# Patient Record
Sex: Female | Born: 1935 | Race: White | Hispanic: No | Smoking: Never smoker
Health system: Southern US, Community
[De-identification: ages and names within clinical notes are randomized; demographics above are authoritative.]

## PROBLEM LIST (undated history)

## (undated) DIAGNOSIS — I10 Essential (primary) hypertension: Secondary | ICD-10-CM

## (undated) DIAGNOSIS — R413 Other amnesia: Secondary | ICD-10-CM

## (undated) DIAGNOSIS — N183 Chronic kidney disease, stage 3 unspecified: Secondary | ICD-10-CM

## (undated) DIAGNOSIS — E785 Hyperlipidemia, unspecified: Secondary | ICD-10-CM

## (undated) DIAGNOSIS — E1039 Type 1 diabetes mellitus with other diabetic ophthalmic complication: Secondary | ICD-10-CM

## (undated) DIAGNOSIS — I272 Pulmonary hypertension, unspecified: Secondary | ICD-10-CM

## (undated) DIAGNOSIS — E669 Obesity, unspecified: Secondary | ICD-10-CM

## (undated) DIAGNOSIS — I5032 Chronic diastolic (congestive) heart failure: Secondary | ICD-10-CM

## (undated) DIAGNOSIS — I255 Ischemic cardiomyopathy: Secondary | ICD-10-CM

## (undated) DIAGNOSIS — I2510 Atherosclerotic heart disease of native coronary artery without angina pectoris: Secondary | ICD-10-CM

## (undated) HISTORY — DX: Ischemic cardiomyopathy: I25.5

## (undated) HISTORY — DX: Atherosclerotic heart disease of native coronary artery without angina pectoris: I25.10

## (undated) HISTORY — DX: Other amnesia: R41.3

## (undated) HISTORY — DX: Obesity, unspecified: E66.9

## (undated) HISTORY — PX: CORONARY ARTERY BYPASS GRAFT: SHX141

## (undated) HISTORY — DX: Type 1 diabetes mellitus with other diabetic ophthalmic complication: E10.39

## (undated) HISTORY — DX: Hyperlipidemia, unspecified: E78.5

## (undated) HISTORY — DX: Chronic kidney disease, stage 3 (moderate): N18.3

## (undated) HISTORY — DX: Chronic kidney disease, stage 3 unspecified: N18.30

## (undated) HISTORY — DX: Chronic diastolic (congestive) heart failure: I50.32

## (undated) HISTORY — DX: Essential (primary) hypertension: I10

## (undated) HISTORY — DX: Pulmonary hypertension, unspecified: I27.20

---

## 1998-04-20 ENCOUNTER — Ambulatory Visit (HOSPITAL_COMMUNITY): Admission: RE | Admit: 1998-04-20 | Discharge: 1998-04-20 | Payer: Self-pay | Admitting: *Deleted

## 1999-09-14 ENCOUNTER — Emergency Department (HOSPITAL_COMMUNITY): Admission: EM | Admit: 1999-09-14 | Discharge: 1999-09-14 | Payer: Self-pay

## 1999-10-21 ENCOUNTER — Ambulatory Visit (HOSPITAL_COMMUNITY): Admission: RE | Admit: 1999-10-21 | Discharge: 1999-10-21 | Payer: Self-pay | Admitting: Family Medicine

## 1999-10-21 ENCOUNTER — Encounter: Payer: Self-pay | Admitting: Family Medicine

## 1999-10-26 ENCOUNTER — Encounter: Admission: RE | Admit: 1999-10-26 | Discharge: 1999-10-26 | Payer: Self-pay | Admitting: Family Medicine

## 1999-10-26 ENCOUNTER — Encounter: Payer: Self-pay | Admitting: Family Medicine

## 2000-10-29 ENCOUNTER — Ambulatory Visit (HOSPITAL_COMMUNITY): Admission: RE | Admit: 2000-10-29 | Discharge: 2000-10-29 | Payer: Self-pay | Admitting: Obstetrics and Gynecology

## 2000-10-29 ENCOUNTER — Encounter: Payer: Self-pay | Admitting: Obstetrics and Gynecology

## 2002-09-01 ENCOUNTER — Encounter: Admission: RE | Admit: 2002-09-01 | Discharge: 2002-09-01 | Payer: Self-pay | Admitting: Family Medicine

## 2002-09-01 ENCOUNTER — Encounter: Payer: Self-pay | Admitting: Family Medicine

## 2002-09-02 ENCOUNTER — Encounter: Admission: RE | Admit: 2002-09-02 | Discharge: 2002-12-01 | Payer: Self-pay | Admitting: Family Medicine

## 2003-09-25 ENCOUNTER — Encounter: Admission: RE | Admit: 2003-09-25 | Discharge: 2003-09-25 | Payer: Self-pay | Admitting: Family Medicine

## 2003-11-18 ENCOUNTER — Inpatient Hospital Stay (HOSPITAL_COMMUNITY): Admission: EM | Admit: 2003-11-18 | Discharge: 2003-11-24 | Payer: Self-pay | Admitting: Emergency Medicine

## 2003-12-23 ENCOUNTER — Encounter
Admission: RE | Admit: 2003-12-23 | Discharge: 2003-12-23 | Payer: Self-pay | Admitting: Thoracic Surgery (Cardiothoracic Vascular Surgery)

## 2004-01-28 ENCOUNTER — Encounter
Admission: RE | Admit: 2004-01-28 | Discharge: 2004-04-27 | Payer: Self-pay | Admitting: Thoracic Surgery (Cardiothoracic Vascular Surgery)

## 2004-02-08 ENCOUNTER — Encounter (HOSPITAL_COMMUNITY): Admission: RE | Admit: 2004-02-08 | Discharge: 2004-05-08 | Payer: Self-pay | Admitting: *Deleted

## 2004-05-09 ENCOUNTER — Encounter (HOSPITAL_COMMUNITY): Admission: RE | Admit: 2004-05-09 | Discharge: 2004-08-07 | Payer: Self-pay | Admitting: *Deleted

## 2005-08-20 ENCOUNTER — Emergency Department (HOSPITAL_COMMUNITY): Admission: EM | Admit: 2005-08-20 | Discharge: 2005-08-20 | Payer: Self-pay | Admitting: Emergency Medicine

## 2007-01-05 IMAGING — CT CT ABDOMEN W/ CM
2 of 5 series · 17 of 46 positions shown, 19 images · IV contrast (omnipaque)
Comparison: none

CLINICAL DATA: Right lower quadrant pain and vomiting.
 ABDOMEN CT WITH CONTRAST:
TECHNIQUE: Multidetector CT imaging of the abdomen was performed following the standard protocol during bolus administration of intravenous contrast. 
 Contrast:  125 cc Omnipaque 300 IV. 
 No comparison.
TECHNIQUE: Multidetector CT imaging of the pelvis was performed following the standard protocol during bolus administration of intravenous contrast. 

[Series 2: abd_pel 5.0 b40f st · axial · 0.73mm/px · z∈[-372,-32]mm · 14 of 78 slices shown, 16 images]
[im 5/78  soft-tissue]
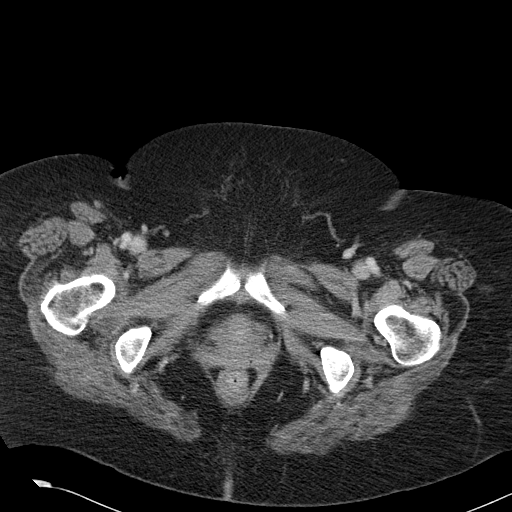
[im 5/78  bone]
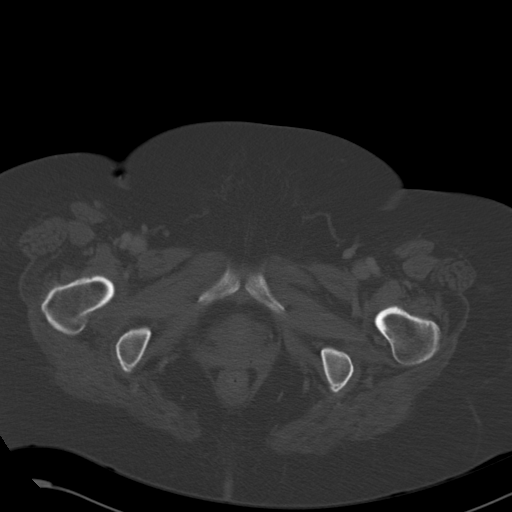
[im 9/78  soft-tissue]
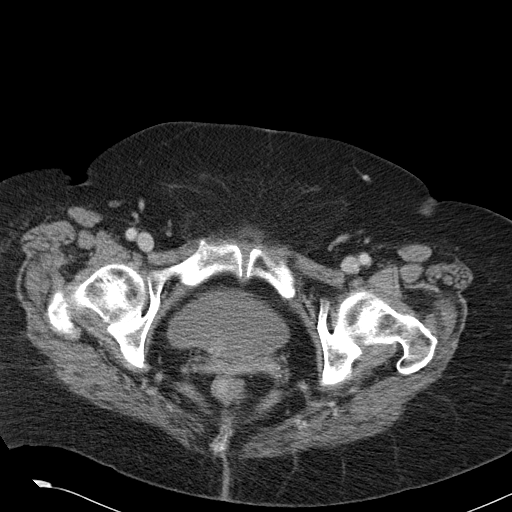
[im 17/78  soft-tissue]
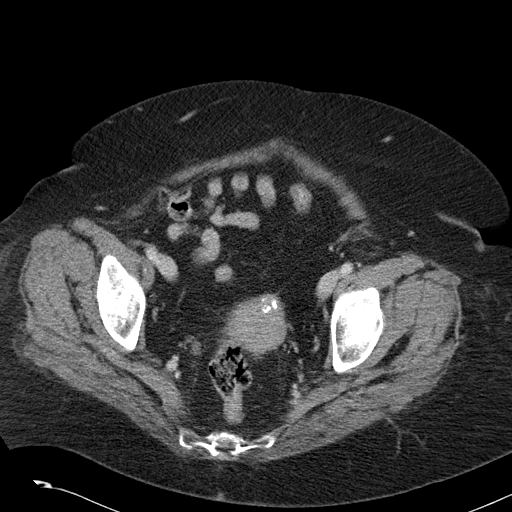
[im 21/78  soft-tissue]
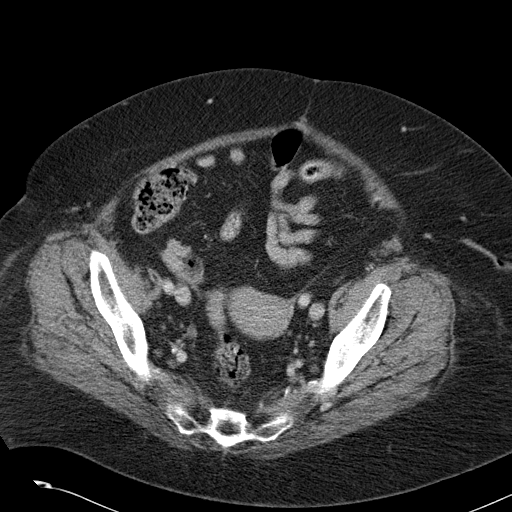
[im 25/78  soft-tissue]
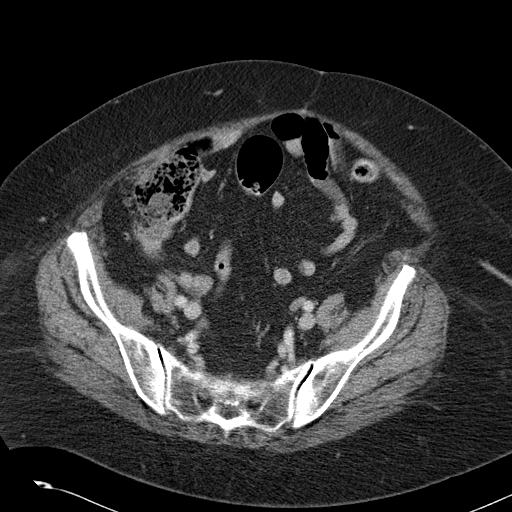
[im 33/78  soft-tissue]
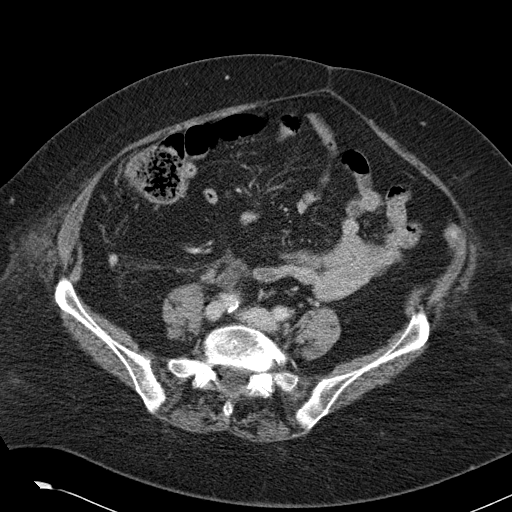
[im 37/78  soft-tissue]
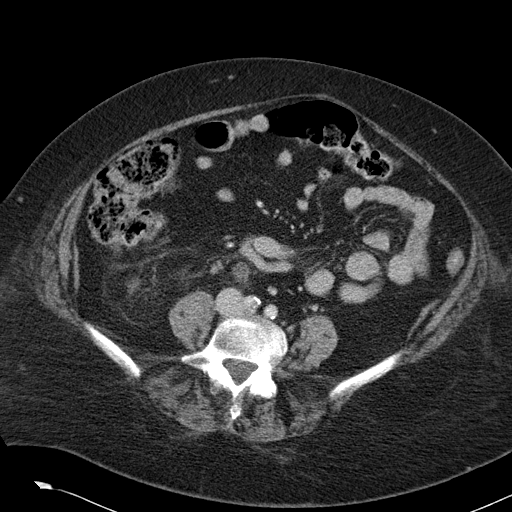
[im 41/78  soft-tissue]
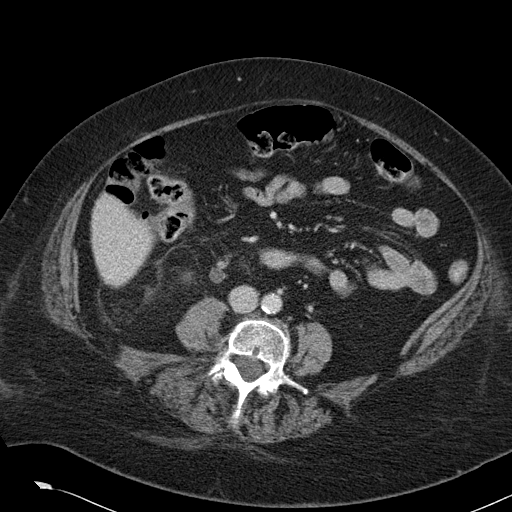
[im 45/78  soft-tissue]
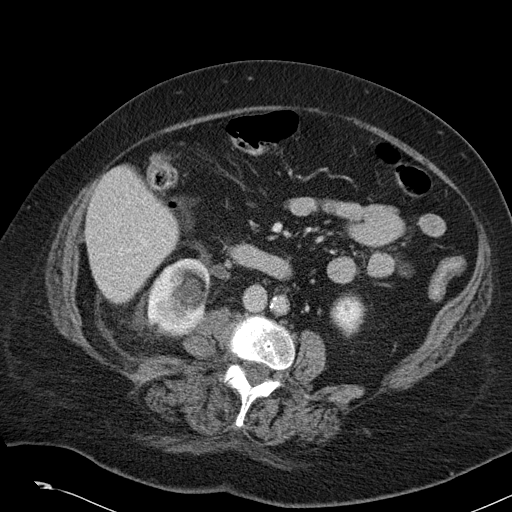
[im 45/78  bone]
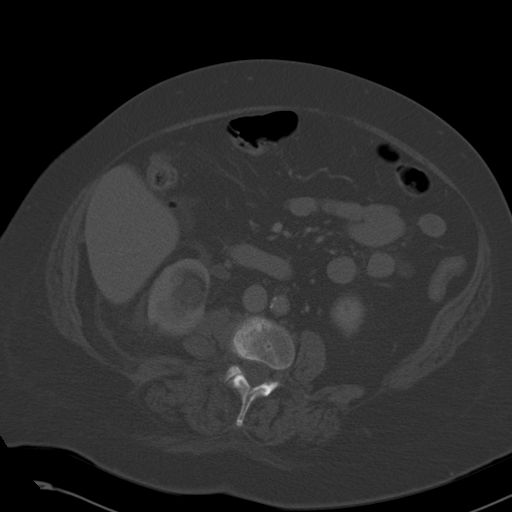
[im 53/78  soft-tissue]
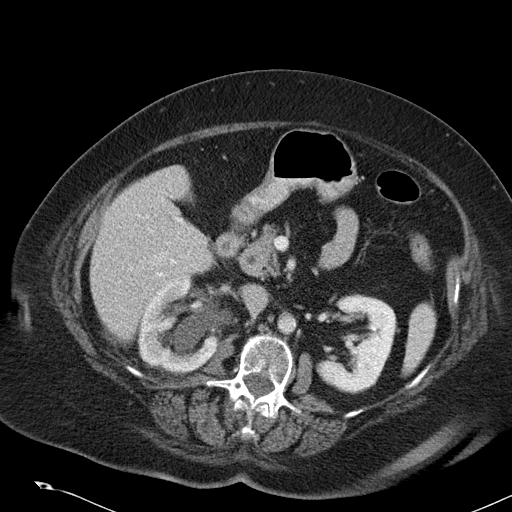
[im 57/78  soft-tissue]
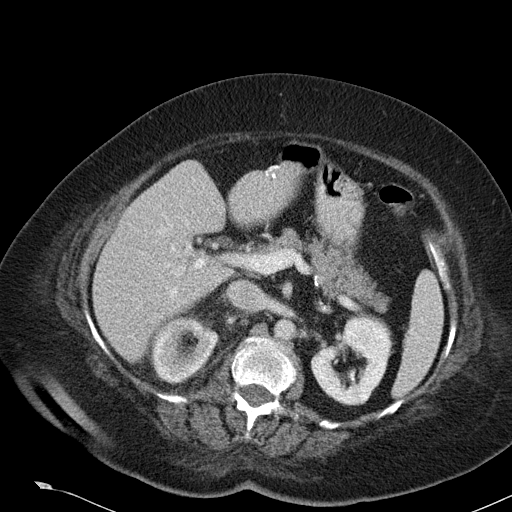
[im 61/78  soft-tissue]
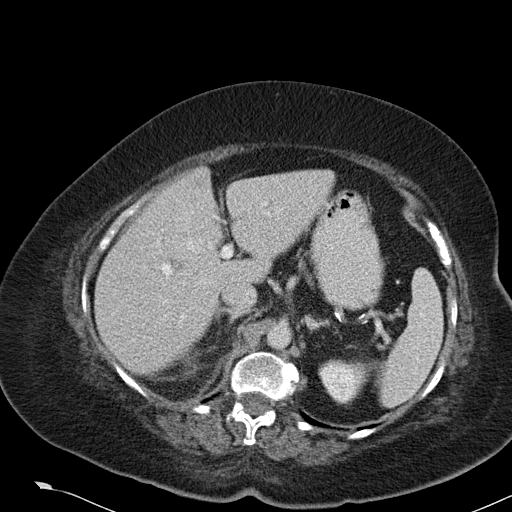
[im 69/78  soft-tissue]
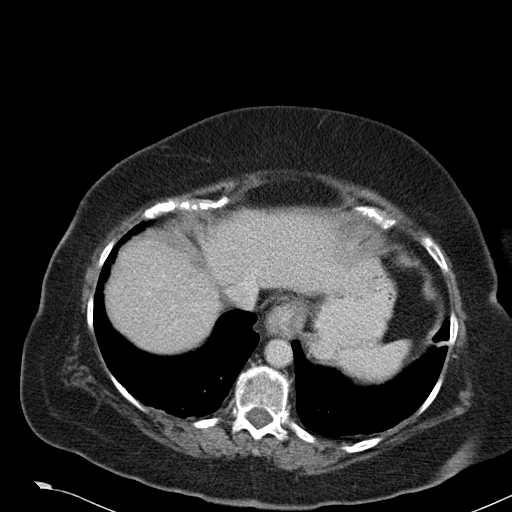
[im 73/78  soft-tissue]
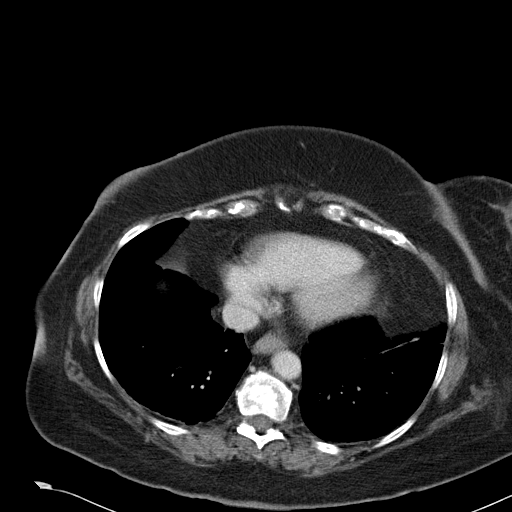

[Series 602: coronal · coronal · 0.79mm/px · 3 of 132 slices shown]
[im 44/132  soft-tissue]
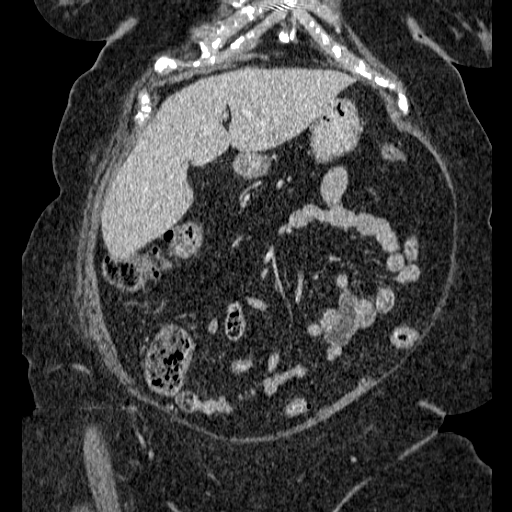
[im 59/132  soft-tissue]
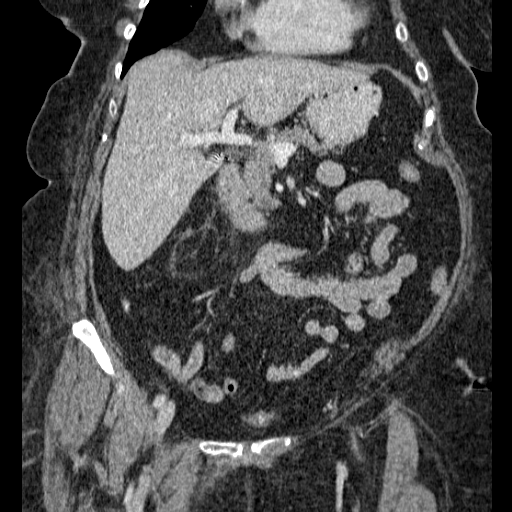
[im 73/132  soft-tissue]
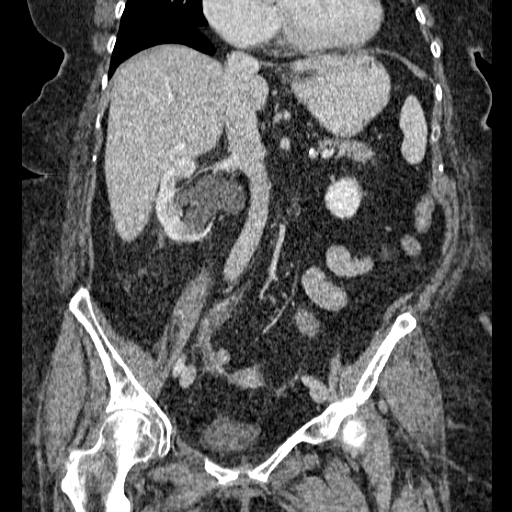

[17 of 46 positions shown; findings below may reference images not displayed]

FINDINGS: The liver, spleen, and pancreas are normal.  There is moderate hydronephrosis on the right.  There is a stone in the distal right ureter causing obstruction of the right kidney.  There is some right perinephric edema.  Unenhanced images were not obtained but I don?t see definite renal calculi.  There is a 15 mm cyst on the left kidney.  There is no adenopathy.  The bowel is normal.
IMPRESSION: Right-sided hydronephrosis due to a distal ureteral stone on the right.
 PELVIS CT WITH CONTRAST:
FINDINGS: The right ureter is dilated.  There is a 3 mm stone at the right ureterovesical junction causing obstruction of the right kidney.  Sigmoid diverticulosis is noted.  There is no free fluid or mass.  The appendix is normal.
IMPRESSION: 3 mm partially obstructing stone in the distal right ureter.

## 2013-02-21 ENCOUNTER — Other Ambulatory Visit: Payer: Self-pay | Admitting: Cardiology

## 2013-02-21 NOTE — Telephone Encounter (Signed)
Pt needs to make an OV with Dr. Mayford Knife before any more refills

## 2013-03-19 ENCOUNTER — Telehealth: Payer: Self-pay | Admitting: Cardiology

## 2013-03-19 NOTE — Telephone Encounter (Signed)
New Problem:  Pt calling requesting blood work with their January appt. No orders in Epic. Please advise pt if she needs labs or not.

## 2013-03-19 NOTE — Telephone Encounter (Signed)
Spoke with pt and confirmed her appt in January. I let pt know we do not follow her Cholesterol and if any other lab work was needed we could order the day of the exam that she would not need to be fasting for.

## 2013-03-21 ENCOUNTER — Other Ambulatory Visit: Payer: Self-pay | Admitting: Cardiology

## 2013-04-28 ENCOUNTER — Encounter: Payer: Self-pay | Admitting: General Surgery

## 2013-04-28 DIAGNOSIS — I2510 Atherosclerotic heart disease of native coronary artery without angina pectoris: Secondary | ICD-10-CM | POA: Insufficient documentation

## 2013-04-28 DIAGNOSIS — E7800 Pure hypercholesterolemia, unspecified: Secondary | ICD-10-CM

## 2013-04-28 DIAGNOSIS — I2589 Other forms of chronic ischemic heart disease: Secondary | ICD-10-CM

## 2013-04-28 DIAGNOSIS — R609 Edema, unspecified: Secondary | ICD-10-CM

## 2013-04-28 DIAGNOSIS — I10 Essential (primary) hypertension: Secondary | ICD-10-CM

## 2013-05-13 ENCOUNTER — Encounter: Payer: Self-pay | Admitting: Cardiology

## 2013-05-13 ENCOUNTER — Ambulatory Visit (INDEPENDENT_AMBULATORY_CARE_PROVIDER_SITE_OTHER): Payer: Medicare Other | Admitting: Cardiology

## 2013-05-13 VITALS — BP 120/74 | HR 64 | Ht 60.5 in | Wt 221.8 lb

## 2013-05-13 DIAGNOSIS — R609 Edema, unspecified: Secondary | ICD-10-CM

## 2013-05-13 DIAGNOSIS — I2510 Atherosclerotic heart disease of native coronary artery without angina pectoris: Secondary | ICD-10-CM

## 2013-05-13 DIAGNOSIS — E7800 Pure hypercholesterolemia, unspecified: Secondary | ICD-10-CM

## 2013-05-13 DIAGNOSIS — I509 Heart failure, unspecified: Secondary | ICD-10-CM

## 2013-05-13 DIAGNOSIS — I272 Pulmonary hypertension, unspecified: Secondary | ICD-10-CM | POA: Insufficient documentation

## 2013-05-13 DIAGNOSIS — I5032 Chronic diastolic (congestive) heart failure: Secondary | ICD-10-CM | POA: Insufficient documentation

## 2013-05-13 DIAGNOSIS — I10 Essential (primary) hypertension: Secondary | ICD-10-CM

## 2013-05-13 NOTE — Progress Notes (Signed)
[address] [place], [place]  [address] Phone: [phone] Fax:  [phone]  Date:  05/13/2013   ID:  Brittney NorrieLinda M Lopez, DOB 24-Jun-1928, MRN [id number]  PCP:  Allean FoundSMITH,Brittney THIELE, MD  Cardiologist:  Armanda Magicraci Jordayn Mink, MD     History of Present Illness: Brittney Lopez is a 80 y.o. female with a history of ASCAD, HTN, dyslipidemia, chronic LE edema who presents today for followup.  She is doing well.  She denies any chest pain, SOB, DOE, LE edema, dizziness, palpitations or syncope.     Wt Readings from Last 3 Encounters:  04/28/13 226 lb 3.2 oz (102.604 kg)     Past Medical History  Diagnosis Date  . Dyslipidemia   . Obesity   . Ischemic cardiomyopathy     Resolved with EF 60% by Echo 2007  . CKD (chronic kidney disease), stage III   . Memory loss     MIld,MMSE 12/19/11 difficulty clock drawing,normal animal fluency  . Coronary artery disease     s/p CABG  . Hypertension   . DM (diabetes mellitus) type I controlled with eye manifestation     diabetic proliferative retinopathy  . Chronic diastolic CHF (congestive heart failure)     grade II diastolic dysfunction on echo 08/2012   . Pulmonary HTN     mild with PASP 40-645mmHg echo 08/2012    Current Outpatient Prescriptions  Medication Sig Dispense Refill  . aspirin 81 MG tablet Take 81 mg by mouth daily.      Marland Kitchen. azelastine (OPTIVAR) 0.05 % ophthalmic solution 1 drop 2 (two) times daily.      Marland Kitchen. doxazosin (CARDURA) 2 MG tablet Take 2 mg by mouth daily.      Marland Kitchen. ezetimibe-simvastatin (VYTORIN) 10-20 MG per tablet Take 1 tablet by mouth daily.      . furosemide (LASIX) 40 MG tablet TAKE 1 TABLET BY MOUTH EVERY DAY  30 tablet  5  . lisinopril (PRINIVIL,ZESTRIL) 20 MG tablet Take 20 mg by mouth daily.      . metoprolol succinate (TOPROL-XL) 100 MG 24 hr tablet Take 100 mg by mouth daily. Take with or immediately following a meal.      . pioglitazone (ACTOS) 15 MG tablet Take 15 mg by mouth daily.      . saxagliptin HCl  (ONGLYZA) 5 MG TABS tablet Take 5 mg by mouth daily.      . Vitamin D, Ergocalciferol, (DRISDOL) 50000 UNITS CAPS capsule Take 50,000 Units by mouth. 50,000 units Twice a week       No current facility-administered medications for this visit.    Allergies:   No Known Allergies  Social History:  The patient  reports that she has never smoked. She does not have any smokeless tobacco history on file. She reports that she does not drink alcohol or use illicit drugs.   Family History:  The patient's family history is not on file.   ROS:  Please see the history of present illness.      All other systems reviewed and negative.   PHYSICAL EXAM: VS:  There were no vitals taken for this visit. Well nourished, well developed, in no acute distress HEENT: normal Neck: no JVD Cardiac:  normal S1, S2; RRR; no murmur Lungs:  clear to auscultation bilaterally, no wheezing, rhonchi or rales Abd: soft, nontender, no hepatomegaly Ext: no edema, chronic venous stasis skin changes Skin: warm and dry Neuro:  CNs 2-12 intact, no focal abnormalities noted  ASSESSMENT AND PLAN:  1. ASCAD s/p remote CABG with no angina  - continue ASA 2. HTN - well controlled  - continue Lisinopril/metoprolol  - check BMET 3. Dyslipidemia  - continue Vytorin  - check fasting lipids and ALT 4. Chronic LE edema 5. Chronic diastolic CHF  - continue Lasix  Followup with me in 6 months  Signed, Armanda Magic, MD 05/13/2013 10:44 AM

## 2013-05-13 NOTE — Patient Instructions (Addendum)
Your physician recommends that you continue on your current medications as directed. Please refer to the Current Medication list given to you today.  Your physician recommends that you return for a FASTING lipid profile, alt, bmet . 05/20/13 anytime   Your physician wants you to follow-up in: 6 months You will receive a reminder letter in the mail two months in advance. If you don't receive a letter, please call our office to schedule the follow-up appointment.

## 2013-05-14 ENCOUNTER — Other Ambulatory Visit (INDEPENDENT_AMBULATORY_CARE_PROVIDER_SITE_OTHER): Payer: Medicare Other

## 2013-05-14 DIAGNOSIS — Z0000 Encounter for general adult medical examination without abnormal findings: Secondary | ICD-10-CM

## 2013-05-20 ENCOUNTER — Other Ambulatory Visit (INDEPENDENT_AMBULATORY_CARE_PROVIDER_SITE_OTHER): Payer: Medicare Other

## 2013-05-20 DIAGNOSIS — E7800 Pure hypercholesterolemia, unspecified: Secondary | ICD-10-CM

## 2013-05-20 LAB — BASIC METABOLIC PANEL
BUN: 43 mg/dL — AB (ref 6–23)
CHLORIDE: 105 meq/L (ref 96–112)
CO2: 28 meq/L (ref 19–32)
Calcium: 8.6 mg/dL (ref 8.4–10.5)
Creatinine, Ser: 1.4 mg/dL — ABNORMAL HIGH (ref 0.4–1.2)
GFR: 38.55 mL/min — AB (ref >60.00)
Glucose, Bld: 115 mg/dL — ABNORMAL HIGH (ref 70–99)
Potassium: 4.2 mEq/L (ref 3.5–5.1)
Sodium: 140 mEq/L (ref 135–145)

## 2013-05-20 LAB — LIPID PANEL
CHOL/HDL RATIO: 4
Cholesterol: 134 mg/dL (ref 0–200)
HDL: 35 mg/dL — AB (ref >39.00)
LDL CALC: 68 mg/dL (ref 0–99)
Triglycerides: 157 mg/dL — ABNORMAL HIGH (ref 0.0–149.0)
VLDL: 31.4 mg/dL (ref 0.0–40.0)

## 2013-05-20 LAB — ALT: ALT: 14 U/L (ref 0–35)

## 2013-05-21 ENCOUNTER — Encounter: Payer: Self-pay | Admitting: General Surgery

## 2013-05-21 ENCOUNTER — Other Ambulatory Visit: Payer: Self-pay | Admitting: General Surgery

## 2013-05-21 DIAGNOSIS — E7800 Pure hypercholesterolemia, unspecified: Secondary | ICD-10-CM

## 2013-09-02 ENCOUNTER — Other Ambulatory Visit: Payer: Self-pay | Admitting: General Surgery

## 2013-09-02 DIAGNOSIS — I272 Pulmonary hypertension, unspecified: Secondary | ICD-10-CM

## 2013-09-03 ENCOUNTER — Ambulatory Visit (HOSPITAL_COMMUNITY): Payer: Medicare Other | Attending: Internal Medicine | Admitting: Radiology

## 2013-09-03 ENCOUNTER — Encounter: Payer: Self-pay | Admitting: Internal Medicine

## 2013-09-03 DIAGNOSIS — I270 Primary pulmonary hypertension: Secondary | ICD-10-CM

## 2013-09-03 DIAGNOSIS — I2789 Other specified pulmonary heart diseases: Secondary | ICD-10-CM | POA: Insufficient documentation

## 2013-09-03 DIAGNOSIS — I272 Pulmonary hypertension, unspecified: Secondary | ICD-10-CM

## 2013-09-03 NOTE — Progress Notes (Signed)
Echocardiogram performed.  

## 2013-09-30 ENCOUNTER — Telehealth: Payer: Self-pay | Admitting: Cardiology

## 2013-09-30 NOTE — Telephone Encounter (Signed)
I left a message on Pat's voicemail that I cannot discuss test results with her at this time. She is not listed as POA or emergency contact for this pt.  I also do not see a signed HIPPA release in the pt's chart.  I advised that the pt can contact our office with questions.

## 2013-09-30 NOTE — Telephone Encounter (Signed)
New message           Pt caregiver is calling about pt results of echocardiogram

## 2013-11-12 ENCOUNTER — Ambulatory Visit: Payer: Medicare Other | Admitting: Cardiology

## 2014-01-15 ENCOUNTER — Encounter: Payer: Self-pay | Admitting: Cardiology

## 2014-01-15 ENCOUNTER — Ambulatory Visit (INDEPENDENT_AMBULATORY_CARE_PROVIDER_SITE_OTHER): Payer: Medicare Other | Admitting: Cardiology

## 2014-01-15 VITALS — BP 132/82 | HR 65 | Ht 65.0 in | Wt 231.2 lb

## 2014-01-15 DIAGNOSIS — I25119 Atherosclerotic heart disease of native coronary artery with unspecified angina pectoris: Secondary | ICD-10-CM

## 2014-01-15 DIAGNOSIS — E7800 Pure hypercholesterolemia, unspecified: Secondary | ICD-10-CM

## 2014-01-15 DIAGNOSIS — I509 Heart failure, unspecified: Secondary | ICD-10-CM

## 2014-01-15 DIAGNOSIS — I10 Essential (primary) hypertension: Secondary | ICD-10-CM

## 2014-01-15 DIAGNOSIS — I209 Angina pectoris, unspecified: Secondary | ICD-10-CM

## 2014-01-15 DIAGNOSIS — I2510 Atherosclerotic heart disease of native coronary artery without angina pectoris: Secondary | ICD-10-CM

## 2014-01-15 DIAGNOSIS — I272 Pulmonary hypertension, unspecified: Secondary | ICD-10-CM

## 2014-01-15 DIAGNOSIS — I5032 Chronic diastolic (congestive) heart failure: Secondary | ICD-10-CM

## 2014-01-15 DIAGNOSIS — I2789 Other specified pulmonary heart diseases: Secondary | ICD-10-CM

## 2014-01-15 DIAGNOSIS — I255 Ischemic cardiomyopathy: Secondary | ICD-10-CM

## 2014-01-15 DIAGNOSIS — I2589 Other forms of chronic ischemic heart disease: Secondary | ICD-10-CM

## 2014-01-15 NOTE — Patient Instructions (Signed)
Your physician recommends that you continue on your current medications as directed. Please refer to the Current Medication list given to you today.  Your physician wants you to follow-up in: 6 Months with Dr Turner You will receive a reminder letter in the mail two months in advance. If you don't receive a letter, please call our office to schedule the follow-up appointment.  

## 2014-01-15 NOTE — Addendum Note (Signed)
Addended by: Nita Sells on: 01/15/2014 04:29 PM   Modules accepted: Orders

## 2014-01-15 NOTE — Progress Notes (Signed)
[address] [place], [place]  [address] Phone: [phone] Fax:  [phone]  Date:  01/15/2014   ID:  Brittney Lopez, DOB 07/02/1928, MRN [id number]  PCP:  Allean Found, MD  Cardiologist:  Armanda Magic, MD     History of Present Illness: Brittney Lopez is a 80 y.o. female with a history of ASCAD, HTN, dyslipidemia, chronic LE edema who presents today for followup. She is doing well. She denies any chest pain,  LE edema, dizziness, palpitations or syncope. She occasionally has some DOE when exerting herself outside.      Wt Readings from Last 3 Encounters:  01/15/14 231 lb 3.2 oz (104.872 kg)  05/13/13 221 lb 12.8 oz (100.608 kg)  04/28/13 226 lb 3.2 oz (102.604 kg)     Past Medical History  Diagnosis Date  . Dyslipidemia   . Obesity   . Ischemic cardiomyopathy     Resolved with EF 60% by Echo 2007  . CKD (chronic kidney disease), stage III   . Memory loss     MIld,MMSE 12/19/11 difficulty clock drawing,normal animal fluency  . Coronary artery disease     s/p CABG  . Hypertension   . DM (diabetes mellitus) type I controlled with eye manifestation     diabetic proliferative retinopathy  . Chronic diastolic CHF (congestive heart failure)     grade II diastolic dysfunction on echo 08/2012   . Pulmonary HTN     mild with PASP 40-89mmHg echo 08/2012    Current Outpatient Prescriptions  Medication Sig Dispense Refill  . aspirin 81 MG tablet Take 81 mg by mouth daily.      Marland Kitchen ezetimibe-simvastatin (VYTORIN) 10-20 MG per tablet Take 1 tablet by mouth daily.      . furosemide (LASIX) 40 MG tablet TAKE 1 TABLET BY MOUTH EVERY DAY  30 tablet  5  . lisinopril (PRINIVIL,ZESTRIL) 20 MG tablet Take 20 mg by mouth daily.      . metoprolol succinate (TOPROL-XL) 100 MG 24 hr tablet Take 100 mg by mouth daily. Take with or immediately following a meal.      . pioglitazone (ACTOS) 15 MG tablet Take 15 mg by mouth daily.      . saxagliptin HCl (ONGLYZA) 5 MG TABS  tablet Take 5 mg by mouth daily.      Marland Kitchen azelastine (OPTIVAR) 0.05 % ophthalmic solution 1 drop 2 (two) times daily.      . Vitamin D, Ergocalciferol, (DRISDOL) 50000 UNITS CAPS capsule Take 50,000 Units by mouth. 50,000 units Twice a week       No current facility-administered medications for this visit.    Allergies:   No Known Allergies  Social History:  The patient  reports that she has never smoked. She does not have any smokeless tobacco history on file. She reports that she does not drink alcohol or use illicit drugs.   Family History:  The patient's family history is not on file.   ROS:  Please see the history of present illness.      All other systems reviewed and negative.   PHYSICAL EXAM: VS:  BP 132/82  Pulse 65  Ht  (1.651 m)  Wt 231 lb 3.2 oz (104.872 kg)  BMI 38.47 kg/m2 Well nourished, well developed, in no acute distress HEENT: normal Neck: no JVD Cardiac:  normal S1, S2; RRR; no murmur Lungs:  clear to auscultation bilaterally, no wheezing, rhonchi or rales Abd: soft, nontender, no hepatomegaly  Ext: no edema Skin: warm and dry Neuro:  CNs 2-12 intact, no focal abnormalities noted  EKG:  NSR with PAC's     ASSESSMENT AND PLAN:  1. ASCAD s/p remote CABG with no angina - continue ASA  2. HTN - well controlled - continue Lisinopril/metoprolol  3. Dyslipidemia - LDL at goal at 70 - continue Vytorin  4. Chronic LE edema - no edema on exam today 5. Chronic diastolic CHF - continue Lasix/BB/ACE I  Followup with me in 6 months   Signed, Armanda Magic, MD 01/15/2014 3:52 PM

## 2014-05-12 ENCOUNTER — Encounter: Payer: Self-pay | Admitting: Cardiology

## 2014-05-21 ENCOUNTER — Other Ambulatory Visit: Payer: Medicare Other

## 2014-07-23 ENCOUNTER — Ambulatory Visit: Payer: Self-pay | Admitting: Cardiology

## 2014-09-23 ENCOUNTER — Ambulatory Visit (INDEPENDENT_AMBULATORY_CARE_PROVIDER_SITE_OTHER): Payer: Medicare Other | Admitting: Cardiology

## 2014-09-23 ENCOUNTER — Encounter: Payer: Self-pay | Admitting: Cardiology

## 2014-09-23 DIAGNOSIS — I2510 Atherosclerotic heart disease of native coronary artery without angina pectoris: Secondary | ICD-10-CM | POA: Diagnosis not present

## 2014-09-23 DIAGNOSIS — I5032 Chronic diastolic (congestive) heart failure: Secondary | ICD-10-CM

## 2014-09-23 DIAGNOSIS — I10 Essential (primary) hypertension: Secondary | ICD-10-CM

## 2014-09-23 DIAGNOSIS — E7800 Pure hypercholesterolemia, unspecified: Secondary | ICD-10-CM

## 2014-09-23 DIAGNOSIS — R609 Edema, unspecified: Secondary | ICD-10-CM

## 2014-09-23 NOTE — Progress Notes (Addendum)
Cardiology Office Note   Date:  09/23/2014   ID:  Brittney Lopez, DOB 1928/07/11, MRN [id number]  PCP:  Allean Found, MD  Cardiologist:   Quintella Reichert, MD   Chief Complaint  Patient presents with  . Follow-up    Chronic diastolic heart failure      History of Present Illness:  Brittney Lopez is a 81 y.o. female with a history of ASCAD, HTN, dyslipidemia, chronic LE edema who presents today for followup. She is doing well. She denies any chest pain, SOB, DOE LE edema, dizziness, palpitations or syncope.   She gets very little exercise and mainly just walks in her house and to the mail box.  Past Medical History  Diagnosis Date  . Dyslipidemia   . Obesity   . Ischemic cardiomyopathy     Resolved with EF 60% by Echo 2007  . CKD (chronic kidney disease), stage III   . Memory loss     MIld,MMSE 12/19/11 difficulty clock drawing,normal animal fluency  . Coronary artery disease     s/p CABG  . Hypertension   . DM (diabetes mellitus) type I controlled with eye manifestation     diabetic proliferative retinopathy  . Chronic diastolic CHF (congestive heart failure)     grade II diastolic dysfunction on echo 08/2012   . Pulmonary HTN     mild with PASP 40-78mmHg echo 08/2012    Past Surgical History  Procedure Laterality Date  . Coronary artery bypass graft       Current Outpatient Prescriptions  Medication Sig Dispense Refill  . aspirin 81 MG tablet Take 81 mg by mouth daily.    Marland Kitchen azelastine (OPTIVAR) 0.05 % ophthalmic solution 1 drop 2 (two) times daily.    . CRESTOR 10 MG tablet Take 10 mg by mouth daily.  5  . furosemide (LASIX) 40 MG tablet TAKE 1 TABLET BY MOUTH EVERY DAY 30 tablet 5  . lisinopril (PRINIVIL,ZESTRIL) 20 MG tablet Take 20 mg by mouth daily.    . metoprolol succinate (TOPROL-XL) 100 MG 24 hr tablet Take 100 mg by mouth daily. Take with or immediately following a meal.    . pioglitazone (ACTOS) 15 MG tablet Take 15 mg by mouth daily.    .  saxagliptin HCl (ONGLYZA) 5 MG TABS tablet Take 5 mg by mouth daily.    . Vitamin D, Ergocalciferol, (DRISDOL) 50000 UNITS CAPS capsule Take 50,000 Units by mouth. 50,000 units Twice a week     No current facility-administered medications for this visit.    Allergies:   Review of patient's allergies indicates no known allergies.    Social History:  The patient  reports that she has never smoked. She does not have any smokeless tobacco history on file. She reports that she does not drink alcohol or use illicit drugs.   Family History:  The patient's family history is not on file.    ROS:  Please see the history of present illness.   Otherwise, review of systems are positive for none.   All other systems are reviewed and negative.    PHYSICAL EXAM: VS:  BP 138/62 mmHg  Pulse 70  Ht  (1.651 m)  Wt 238 lb 3.2 oz (108.047 kg)  BMI 39.64 kg/m2  SpO2 98% , BMI Body mass index is 39.64 kg/(m^2). GEN: Well nourished, well developed, in no acute distress HEENT: normal Neck: no JVD, carotid bruits, or masses Cardiac: RRR; no murmurs, rubs, or gallops,no edema  Respiratory:  clear to auscultation bilaterally, normal work of breathing GI: soft, nontender, nondistended, + BS MS: no deformity or atrophy Skin: warm and dry, no rash Neuro:  Strength and sensation are intact Psych: euthymic mood, full affect   EKG:  EKG is not ordered today.    Recent Labs: No results found for requested labs within last 365 days.    Lipid Panel    Component Value Date/Time   CHOL 134 05/20/2013 0854   TRIG 157.0* 05/20/2013 0854   HDL 35.00* 05/20/2013 0854   CHOLHDL 4 05/20/2013 0854   VLDL 31.4 05/20/2013 0854   LDLCALC 68 05/20/2013 0854      Wt Readings from Last 3 Encounters:  09/23/14 238 lb 3.2 oz (108.047 kg)  01/15/14 231 lb 3.2 oz (104.872 kg)  05/13/13 221 lb 12.8 oz (100.608 kg)    ASSESSMENT AND PLAN:  1. ASCAD s/p remote CABG with no angina - continue ASA  2. HTN -  well controlled - continue Lisinopril/metoprolol  3. Dyslipidemia - LDL at goal at 68 on 05/20/2013 - continue Vytorin  - recheck FLP and ALT 4. Chronic LE edema - no edema on exam today 5. Chronic diastolic CHF - continue Lasix/BB/ACE I   Current medicines are reviewed at length with the patient today.  The patient does not have concerns regarding medicines.  The following changes have been made:  no change  Labs/ tests ordered today include: BMET, FLP and ALT   Orders Placed This Encounter  Procedures  . Lipid Profile  . Hepatic function panel     Disposition:   FU with me in 6 months  Signed, Quintella Reichert, MD  09/23/2014 4:12 PM    [facility] [address] [place], [place], [place]  [address] Phone: [phone]; Fax: [phone]

## 2014-09-23 NOTE — Patient Instructions (Addendum)
Medication Instructions:  Your physician recommends that you continue on your current medications as directed. Please refer to the Current Medication list given to you today.   Labwork: Your physician recommends that you return for FASTING lab work.  Testing/Procedures: None  Follow-Up: Your physician wants you to follow-up in: 6 months with Dr. Turner. You will receive a reminder letter in the mail two months in advance. If you don't receive a letter, please call our office to schedule the follow-up appointment.   Any Other Special Instructions Will Be Listed Below (If Applicable).   

## 2014-10-01 ENCOUNTER — Encounter: Payer: Self-pay | Admitting: Cardiology

## 2014-10-02 ENCOUNTER — Telehealth: Payer: Self-pay

## 2014-10-02 DIAGNOSIS — E7800 Pure hypercholesterolemia, unspecified: Secondary | ICD-10-CM

## 2014-10-02 MED ORDER — ROSUVASTATIN CALCIUM 20 MG PO TABS: 20.0000 mg | ORAL_TABLET | Freq: Every day | ORAL | Status: DC

## 2014-10-02 NOTE — Telephone Encounter (Signed)
-----   Message from Quintella Reichert, MD sent at 10/01/2014  4:36 PM EDT ----- LDL not at goal - increase Crestor to 20mg  daily and recheck FLp and ALT in 6 weeks

## 2014-10-02 NOTE — Telephone Encounter (Signed)
Instructed patient to INCREASE CRESTOR to 20 mg daily. FLP and ALT scheduled for 7/11. Patient agrees with treatment plan.

## 2014-11-16 ENCOUNTER — Other Ambulatory Visit: Payer: Medicare Other

## 2015-04-29 ENCOUNTER — Encounter: Payer: Self-pay | Admitting: Cardiology

## 2015-05-14 ENCOUNTER — Ambulatory Visit: Payer: Medicare Other | Admitting: Cardiology

## 2015-10-10 ENCOUNTER — Other Ambulatory Visit: Payer: Self-pay | Admitting: Cardiology

## 2016-01-18 ENCOUNTER — Other Ambulatory Visit: Payer: Self-pay | Admitting: Cardiology

## 2016-05-14 ENCOUNTER — Other Ambulatory Visit: Payer: Self-pay | Admitting: Cardiology

## 2016-10-05 ENCOUNTER — Encounter: Payer: Self-pay | Admitting: Cardiology

## 2016-10-06 ENCOUNTER — Encounter (HOSPITAL_COMMUNITY): Payer: Self-pay | Admitting: Nurse Practitioner

## 2016-10-06 ENCOUNTER — Emergency Department (HOSPITAL_BASED_OUTPATIENT_CLINIC_OR_DEPARTMENT_OTHER)
Admit: 2016-10-06 | Discharge: 2016-10-06 | Disposition: A | Discharge status: Home / Self Care | Payer: Medicare Other | Attending: Emergency Medicine | Admitting: Emergency Medicine

## 2016-10-06 ENCOUNTER — Emergency Department (HOSPITAL_COMMUNITY): Payer: Medicare Other

## 2016-10-06 ENCOUNTER — Encounter: Payer: Self-pay | Admitting: Cardiology

## 2016-10-06 ENCOUNTER — Ambulatory Visit (INDEPENDENT_AMBULATORY_CARE_PROVIDER_SITE_OTHER): Payer: Medicare Other | Admitting: Cardiology

## 2016-10-06 ENCOUNTER — Emergency Department (HOSPITAL_COMMUNITY)
Admission: EM | Admit: 2016-10-06 | Discharge: 2016-10-07 | Disposition: A | Discharge status: Home / Self Care | Payer: Medicare Other | Attending: Emergency Medicine | Admitting: Emergency Medicine

## 2016-10-06 VITALS — HR 80 | Ht 65.0 in | Wt 222.8 lb

## 2016-10-06 DIAGNOSIS — E103519 Type 1 diabetes mellitus with proliferative diabetic retinopathy with macular edema, unspecified eye: Secondary | ICD-10-CM | POA: Diagnosis not present

## 2016-10-06 DIAGNOSIS — L03115 Cellulitis of right lower limb: Secondary | ICD-10-CM | POA: Diagnosis not present

## 2016-10-06 DIAGNOSIS — N183 Chronic kidney disease, stage 3 (moderate): Secondary | ICD-10-CM | POA: Insufficient documentation

## 2016-10-06 DIAGNOSIS — R2240 Localized swelling, mass and lump, unspecified lower limb: Secondary | ICD-10-CM | POA: Diagnosis present

## 2016-10-06 DIAGNOSIS — Z7982 Long term (current) use of aspirin: Secondary | ICD-10-CM | POA: Insufficient documentation

## 2016-10-06 DIAGNOSIS — E7800 Pure hypercholesterolemia, unspecified: Secondary | ICD-10-CM

## 2016-10-06 DIAGNOSIS — I2510 Atherosclerotic heart disease of native coronary artery without angina pectoris: Secondary | ICD-10-CM | POA: Insufficient documentation

## 2016-10-06 DIAGNOSIS — I10 Essential (primary) hypertension: Secondary | ICD-10-CM

## 2016-10-06 DIAGNOSIS — M7989 Other specified soft tissue disorders: Secondary | ICD-10-CM

## 2016-10-06 DIAGNOSIS — I5032 Chronic diastolic (congestive) heart failure: Secondary | ICD-10-CM

## 2016-10-06 DIAGNOSIS — R778 Other specified abnormalities of plasma proteins: Secondary | ICD-10-CM

## 2016-10-06 DIAGNOSIS — I130 Hypertensive heart and chronic kidney disease with heart failure and stage 1 through stage 4 chronic kidney disease, or unspecified chronic kidney disease: Secondary | ICD-10-CM | POA: Diagnosis not present

## 2016-10-06 DIAGNOSIS — E1022 Type 1 diabetes mellitus with diabetic chronic kidney disease: Secondary | ICD-10-CM | POA: Insufficient documentation

## 2016-10-06 DIAGNOSIS — Z79899 Other long term (current) drug therapy: Secondary | ICD-10-CM | POA: Insufficient documentation

## 2016-10-06 DIAGNOSIS — Z7984 Long term (current) use of oral hypoglycemic drugs: Secondary | ICD-10-CM | POA: Insufficient documentation

## 2016-10-06 DIAGNOSIS — R748 Abnormal levels of other serum enzymes: Secondary | ICD-10-CM | POA: Insufficient documentation

## 2016-10-06 DIAGNOSIS — R7989 Other specified abnormal findings of blood chemistry: Secondary | ICD-10-CM

## 2016-10-06 DIAGNOSIS — Z951 Presence of aortocoronary bypass graft: Secondary | ICD-10-CM | POA: Diagnosis not present

## 2016-10-06 DIAGNOSIS — L0390 Cellulitis, unspecified: Secondary | ICD-10-CM | POA: Insufficient documentation

## 2016-10-06 LAB — CBC
HEMATOCRIT: 41.2 % (ref 36.0–46.0)
HEMOGLOBIN: 13.1 g/dL (ref 12.0–15.0)
MCH: 29.8 pg (ref 26.0–34.0)
MCHC: 31.8 g/dL (ref 30.0–36.0)
MCV: 93.8 fL (ref 78.0–100.0)
Platelets: 237 10*3/uL (ref 150–400)
RBC: 4.39 MIL/uL (ref 3.87–5.11)
RDW: 15 % (ref 11.5–15.5)
WBC: 8.9 10*3/uL (ref 4.0–10.5)

## 2016-10-06 LAB — I-STAT TROPONIN, ED
TROPONIN I, POC: 0.11 ng/mL — AB (ref 0.00–0.08)
TROPONIN I, POC: 0.11 ng/mL — AB (ref 0.00–0.08)
Troponin i, poc: 0.12 ng/mL (ref 0.00–0.08)

## 2016-10-06 LAB — BASIC METABOLIC PANEL
Anion gap: 11 (ref 5–15)
BUN: 23 mg/dL — ABNORMAL HIGH (ref 6–20)
CHLORIDE: 108 mmol/L (ref 101–111)
CO2: 24 mmol/L (ref 22–32)
CREATININE: 1.06 mg/dL — AB (ref 0.44–1.00)
Calcium: 8.6 mg/dL — ABNORMAL LOW (ref 8.9–10.3)
GFR calc non Af Amer: 47 mL/min — ABNORMAL LOW (ref >60)
GFR, EST AFRICAN AMERICAN: 54 mL/min — AB (ref >60)
Glucose, Bld: 152 mg/dL — ABNORMAL HIGH (ref 65–99)
POTASSIUM: 3.8 mmol/L (ref 3.5–5.1)
SODIUM: 143 mmol/L (ref 135–145)

## 2016-10-06 LAB — TROPONIN I: TROPONIN I: 0.11 ng/mL — AB (ref <0.03)

## 2016-10-06 LAB — BRAIN NATRIURETIC PEPTIDE: B NATRIURETIC PEPTIDE 5: 4245 pg/mL — AB (ref 0.0–100.0)

## 2016-10-06 MED ORDER — SULFAMETHOXAZOLE-TRIMETHOPRIM 800-160 MG PO TABS: 1.0000 | ORAL_TABLET | Freq: Two times a day (BID) | ORAL | 0 refills | Status: DC

## 2016-10-06 MED ORDER — CEPHALEXIN 500 MG PO CAPS: 500.0000 mg | ORAL_CAPSULE | Freq: Two times a day (BID) | ORAL | 0 refills | Status: DC

## 2016-10-06 MED ORDER — CEPHALEXIN 250 MG PO CAPS
500.0000 mg | ORAL_CAPSULE | Freq: Once | ORAL | Status: AC
  Administered 2016-10-06: 500 mg via ORAL
  Filled 2016-10-06: qty 2

## 2016-10-06 MED ORDER — SULFAMETHOXAZOLE-TRIMETHOPRIM 800-160 MG PO TABS
1.0000 | ORAL_TABLET | Freq: Once | ORAL | Status: AC
  Administered 2016-10-06: 1 via ORAL
  Filled 2016-10-06: qty 1

## 2016-10-06 NOTE — ED Provider Notes (Signed)
[facility]-EMERGENCY DEPT Provider Note   CSN: [id number] Arrival date & time: 10/06/16  1616     History   Chief Complaint Chief Complaint  Patient presents with  . Leg Swelling    HPI Brittney Lopez is a 83 y.o. female.  HPI Pt comes to the ER with cc of leg swelling. PT has hx of CKD, CAD s/p CABG several years ago. She reports that she went to her Cardiologist for a routine visit, and they noted that pt was having leg swelling and redness - so she was asked to come to the ER.  At triage, a troponin was ordered, which is slightly elevated.. Pt denies any chest pain or new DIB. She reports that she walks around her townhouse w/o shortness of breath, but she does appreciate shortness of breath when she  Walks to the car in the parking lot. There has been no change in her shortness of breath as of late. Pt has no associated chest pain.  Pt reports that her leg swelling is not necessarily new either. She has had weeping from her skin in the past. She doesn't wear any compression hose. Pt has no hx of PE/DVT. Pt has had some malaise the last 3, 4 days, but she has had no fevers, chills.  Past Medical History:  Diagnosis Date  . Chronic diastolic CHF (congestive heart failure) (HCC)    grade II diastolic dysfunction on echo 08/2012   . CKD (chronic kidney disease), stage III   . Coronary artery disease    s/p CABG  . DM (diabetes mellitus) type I controlled with eye manifestation (HCC)    diabetic proliferative retinopathy  . Dyslipidemia   . Hypertension   . Ischemic cardiomyopathy    Resolved with EF 60% by Echo 2007  . Memory loss    MIld,MMSE 12/19/11 difficulty clock drawing,normal animal fluency  . Obesity   . Pulmonary HTN (HCC)    mild with PASP 40-19mmHg echo 08/2012 and resolved by echo 2015    Patient Active Problem List   Diagnosis Date Noted  . Cellulitis 10/06/2016  . Chronic diastolic CHF (congestive heart failure) (HCC)   . Pulmonary HTN (HCC)   . Essential  hypertension, benign 04/28/2013  . Coronary atherosclerosis of native coronary artery 04/28/2013  . Pure hypercholesterolemia 04/28/2013  . Edema 04/28/2013    Past Surgical History:  Procedure Laterality Date  . CORONARY ARTERY BYPASS GRAFT      OB History    No data available       Home Medications    Prior to Admission medications   Medication Sig Start Date End Date Taking? Authorizing Provider  aspirin 81 MG tablet Take 81 mg by mouth daily.   Yes [provider]  Cyanocobalamin (VITAMIN B-12) 5000 MCG SUBL Place 5,000 mcg under the tongue daily.   Yes [provider]  furosemide (LASIX) 40 MG tablet TAKE 1 TABLET BY MOUTH EVERY DAY 03/21/13  Yes Turner, Traci R, MD  lisinopril (PRINIVIL,ZESTRIL) 20 MG tablet Take 20 mg by mouth daily.   Yes [provider]  metFORMIN (GLUCOPHAGE-XR) 500 MG 24 hr tablet Take 500 mg by mouth 2 (two) times daily. 09/22/16  Yes [provider]  metoprolol succinate (TOPROL-XL) 100 MG 24 hr tablet Take 100 mg by mouth daily. Take with or immediately following a meal.   Yes [provider]  Multiple Vitamin (MULTIVITAMIN) tablet Take 1 tablet by mouth daily.   Yes [provider]  rosuvastatin (CRESTOR) 20 MG tablet TAKE 1 TABLET BY MOUTH EVERY DAY **NEEDS OV** 05/15/16  Yes Turner, Traci R, MD  saxagliptin HCl (ONGLYZA) 5 MG TABS tablet Take 5 mg by mouth daily.   Yes [provider]  cephALEXin (KEFLEX) 500 MG capsule Take 1 capsule (500 mg total) by mouth 2 (two) times daily. 10/06/16   Derwood Kaplan, MD  sulfamethoxazole-trimethoprim (BACTRIM DS,SEPTRA DS) 800-160 MG tablet Take 1 tablet by mouth 2 (two) times daily. 10/06/16 10/13/16  Derwood Kaplan, MD  Vitamin D, Ergocalciferol, (DRISDOL) 50000 UNITS CAPS capsule Take 50,000 Units by mouth. 50,000 units Twice a week    [provider]    Family History Family History  Problem Relation Age of Onset  . Other Unknown         PT STATES FM HAS NO HEALTH ISSUES    Social History Social History  Substance Use Topics  . Smoking status: Never Smoker  . Smokeless tobacco: Never Used  . Alcohol use No     Allergies   Patient has no known allergies.   Review of Systems Review of Systems  Constitutional: Negative for activity change and unexpected weight change.  Respiratory: Negative for chest tightness and shortness of breath.   Cardiovascular: Negative for chest pain.  Gastrointestinal: Negative for nausea.  Skin: Positive for rash.  Allergic/Immunologic: Negative for immunocompromised state.  Hematological: Does not bruise/bleed easily.  All other systems reviewed and are negative.    Physical Exam Updated Vital Signs BP (!) 146/79   Pulse 80   Temp 98 F (36.7 C) (Oral)   Resp (!) 27   SpO2 97%   Physical Exam  Constitutional: She is oriented to person, place, and time. She appears well-developed.  HENT:  Head: Normocephalic and atraumatic.  Eyes: EOM are normal.  Neck: Normal range of motion. Neck supple. No JVD present.  Cardiovascular: Normal rate and regular rhythm.  Exam reveals no gallop.   Murmur heard. Pulmonary/Chest: Effort normal and breath sounds normal. She has no wheezes. She has no rales.  Abdominal: Bowel sounds are normal.  Neurological: She is alert and oriented to person, place, and time.  Skin: Skin is dry. Rash noted. There is erythema.  RLE has unilateral swelling and redness. There is warmth to touch and tenderness to palpation. Pt has 2+ pitting edema.  Nursing note and vitals reviewed.      ED Treatments / Results  Labs (all labs ordered are listed, but only abnormal results are displayed) Labs Reviewed  BASIC METABOLIC PANEL - Abnormal; Notable for the following:       Result Value   Glucose, Bld 152 (*)    BUN 23 (*)    Creatinine, Ser 1.06 (*)    Calcium 8.6 (*)    GFR calc non Af Amer 47 (*)    GFR calc Af Amer 54 (*)    All other components  within normal limits  TROPONIN I - Abnormal; Notable for the following:    Troponin I 0.11 (*)    All other components within normal limits  D-DIMER, QUANTITATIVE (NOT AT [facility] - Abnormal; Notable for the following:    D-Dimer, Quant 1.40 (*)    All other components within normal limits  BRAIN NATRIURETIC PEPTIDE - Abnormal; Notable for the following:    B Natriuretic Peptide 4,245.0 (*)    All other components within normal limits  I-STAT TROPOININ, ED - Abnormal; Notable for the following:    Troponin i, poc  0.12 (*)    All other components within normal limits  I-STAT TROPOININ, ED - Abnormal; Notable for the following:    Troponin i, poc 0.11 (*)    All other components within normal limits  I-STAT TROPOININ, ED - Abnormal; Notable for the following:    Troponin i, poc 0.11 (*)    All other components within normal limits  CBC    EKG  EKG Interpretation  Date/Time:  Friday October 06 2016 17:33:14 EDT Ventricular Rate:  79 PR Interval:    QRS Duration: 87 QT Interval:  468 QTC Calculation: 537 R Axis:   2 Text Interpretation:  Sinus rhythm Low voltage, precordial leads Borderline repolarization abnormality Prolonged QT interval Nonspecific ST and T wave abnormality ST wave flattening is new Confirmed by Derwood Kaplan [phone]) on 10/06/2016 6:14:31 PM       Radiology Dg Chest 2 View  Result Date: 10/06/2016 CLINICAL DATA:  Bilateral lower extremity edema for a few weeks, worsening. Onset of shortness of breath and nausea this week. EXAM: CHEST  2 VIEW COMPARISON:  PA and lateral chest 12/23/2003. FINDINGS: There is marked cardiomegaly without edema. No consolidative process, pneumothorax or effusion. Small area of linear scar in the lingula is unchanged. The patient is status post CABG. Atherosclerosis noted. No acute bony abnormality. IMPRESSION: Cardiomegaly without edema.  No acute disease. Atherosclerosis. Electronically Signed   By: Drusilla Kanner M.D.   On: 10/06/2016  18:13    Procedures Procedures (including critical care time)  Medications Ordered in ED Medications  cephALEXin (KEFLEX) capsule 500 mg (500 mg Oral Given 10/06/16 2342)  sulfamethoxazole-trimethoprim (BACTRIM DS,SEPTRA DS) 800-160 MG per tablet 1 tablet (1 tablet Oral Given 10/06/16 2342)     Initial Impression / Assessment and Plan / ED Course  I have reviewed the triage vital signs and the nursing notes.  Pertinent labs & imaging results that were available during my care of the patient were reviewed by me and considered in my medical decision making (see chart for details).  Clinical Course as of Oct 08 26  Sat Oct 07, 2016  0002 I spoke with the Cardiology fellow on call. I discussed my concerns for elevated troponin, and that it could be NSTEMI. I informed him that pt has no chest pain or new DIB and no clear symptoms of ischemia. With the troponin elevation, the hospitalist wanted cardiology to admit.  The fellow reviewed patient's ekg, and asked me to confirm with the family that there is no worsening dyspnea or chest pain - and so I went in to the room with the phone turned on and did a repeat history where patient confirmed that her dib is not new, or worsening and that there is no chest pain. Pt's daughter from [place] was in the room, and confirmed what patient stated. Cardiologist then gave the recommendation of admission to the hospital vs. Close f.u in the clinic, and pt opted for the latter. I asked the daughter if she was comfortable with that plan - and they understand the risk of not getting optimal diagnostic workup or therapeutic workup done on the heart side - and after internal discussion between the patient and daughter, she decided to stay home with her mother this weekend and see Dr. Mayford Knife in the clinic on Monday. The fellow and I both will send an email to Dr. Mayford Knife.  [AN]  0022 BNP finally resulted- it is significantly elevated. Again - clinically, lungs were  clear, there was no  tachycardia, tachypnea, hypoxia, lungs were clear, Cr is WNL - so pt was not in decompensated CHF. I didn't even order CXR given the lung findings and no CHF like symptoms. I have called patients home and advised that she double her lasix. The daughter will give her the extra dose right now before she sleeps.  Daughter made aware that we think her presentation is more like CHF and not ACS - so to give double lasix all weekend, and if not better, she might need admission foir diuresing. B Natriuretic Peptide: (!) 4,245.0 [AN]    Clinical Course User Index [AN] Derwood Kaplan, MD    Pt comes in with cc of leg swelling and redness. DVT study ordered, although suspicion is higher for cellulitis.  PT had a troponin sent out from triage, and it is mildly elevated. PT has no chest pain, and she has no new shortness of breath or even worsening of her shortness of breath. On my exam, there is no evidence of decompensated CHF. Pitting edema is allegedly not new. I am not sure what to make of this trop elevation. Cards consulted. BNP and repeat trop sent.     Final Clinical Impressions(s) / ED Diagnoses   Final diagnoses:  Cellulitis of right lower extremity  Elevated troponin    New Prescriptions Discharge Medication List as of 10/06/2016 11:39 PM    START taking these medications   Details  cephALEXin (KEFLEX) 500 MG capsule Take 1 capsule (500 mg total) by mouth 2 (two) times daily., Starting Fri 10/06/2016, Print    sulfamethoxazole-trimethoprim (BACTRIM DS,SEPTRA DS) 800-160 MG tablet Take 1 tablet by mouth 2 (two) times daily., Starting Fri 10/06/2016, Until Fri 10/13/2016, Print         Derwood Kaplan, MD 10/07/16 1975

## 2016-10-06 NOTE — ED Triage Notes (Signed)
Pt presents with c/o BLE edema. The edema began a few weeks ago and has been progressively worse since onset. This week shes developed some shortness of breath, nausea, redness in her BLE and just does not feel well. She denies any fevers, pain. She was sent from Dr turners office today for further evaluation of CHF exacerbation and cellulitis of lower extremity.

## 2016-10-06 NOTE — ED Notes (Signed)
[facility]-RN at [facility] notified of elevated Trop

## 2016-10-06 NOTE — ED Notes (Signed)
On way to XR 

## 2016-10-06 NOTE — ED Notes (Signed)
EKG scan available to view in EPIC from Dr Malachy Mood office this afternoon, discussed with Dr. Jeraldine Loots no additional EKG to be obtained at this time

## 2016-10-06 NOTE — Progress Notes (Signed)
VASCULAR LAB PRELIMINARY  PRELIMINARY  PRELIMINARY  PRELIMINARY  Right lower extremity venous duplex completed.    Preliminary report:  Right:  No evidence of DVT, superficial thrombosis, or Baker's cyst.  Brittney Lopez, RVS 10/06/2016, 9:57 PM

## 2016-10-06 NOTE — Patient Instructions (Signed)
Medication Instructions:  Your physician recommends that you continue on your current medications as directed. Please refer to the Current Medication list given to you today.   Labwork: None  Testing/Procedures: None  Follow-Up: Your physician wants you to follow-up in: 6 months with Dr. Mayford Knife. You will receive a reminder letter in the mail two months in advance. If you don't receive a letter, please call our office to schedule the follow-up appointment.   Any Other Special Instructions Will Be Listed Below (If Applicable). PLEASE PROCEED TO THE EMERGENCY ROOM FOR EVALUATION OF POSSIBLE CELLULITIS AND CHF.    If you need a refill on your cardiac medications before your next appointment, please call your pharmacy.

## 2016-10-06 NOTE — Progress Notes (Signed)
Cardiology Office Note    Date:  10/06/2016   ID:  Brittney Lopez, DOB [birth year], MRN [id number]  PCP:  Merri Brunette, MD  Cardiologist:  Armanda Magic, MD   Chief Complaint  Patient presents with  . Congestive Heart Failure  . Coronary Artery Disease  . Hyperlipidemia  . Hypertension    History of Present Illness:  Brittney Lopez is a 83 y.o. female with a history of ASCAD s/p remote CABG, HTN, dyslipidemia, chronic diastolic CHF,  LE edema who presents today for followup. She has not been feeling well this week.  According to her daughter, she has not been peeing much.  Her legs have been swelling over the past week and she now has severe erythema of her RLE along with nausea. She denies any chest pain,  PND, orthopnea, dizziness, palpitations or syncope.  She has some mild SOB when she exerts herself.  She denies any fever or chills. She gets very little exercise but has not felt like doing anything.   Past Medical History:  Diagnosis Date  . Chronic diastolic CHF (congestive heart failure) (HCC)    grade II diastolic dysfunction on echo 08/2012   . CKD (chronic kidney disease), stage III   . Coronary artery disease    s/p CABG  . DM (diabetes mellitus) type I controlled with eye manifestation (HCC)    diabetic proliferative retinopathy  . Dyslipidemia   . Hypertension   . Ischemic cardiomyopathy    Resolved with EF 60% by Echo 2007  . Memory loss    MIld,MMSE 12/19/11 difficulty clock drawing,normal animal fluency  . Obesity   . Pulmonary HTN (HCC)    mild with PASP 40-45mmHg echo 08/2012 and resolved by echo 2015    Past Surgical History:  Procedure Laterality Date  . CORONARY ARTERY BYPASS GRAFT      Current Medications: Current Meds  Medication Sig  . aspirin 81 MG tablet Take 81 mg by mouth daily.  . Cyanocobalamin (VITAMIN B-12) 5000 MCG SUBL Place 5,000 mcg under the tongue daily.  . furosemide (LASIX) 40 MG tablet TAKE 1 TABLET BY MOUTH EVERY DAY  .  lisinopril (PRINIVIL,ZESTRIL) 20 MG tablet Take 20 mg by mouth daily.  . metFORMIN (GLUCOPHAGE-XR) 500 MG 24 hr tablet Take 500 mg by mouth 2 (two) times daily.  . metoprolol succinate (TOPROL-XL) 100 MG 24 hr tablet Take 100 mg by mouth daily. Take with or immediately following a meal.  . Multiple Vitamin (MULTIVITAMIN) tablet Take 1 tablet by mouth daily.  . rosuvastatin (CRESTOR) 20 MG tablet TAKE 1 TABLET BY MOUTH EVERY DAY **NEEDS OV**  . saxagliptin HCl (ONGLYZA) 5 MG TABS tablet Take 5 mg by mouth daily.  . Vitamin D, Ergocalciferol, (DRISDOL) 50000 UNITS CAPS capsule Take 50,000 Units by mouth. 50,000 units Twice a week    Allergies:   Patient has no known allergies.   Social History   Social History  . Marital status: Divorced    Spouse name: N/A  . Number of children: 4  . Years of education: 12   Occupational History  . retired    Social History Main Topics  . Smoking status: Never Smoker  . Smokeless tobacco: Never Used  . Alcohol use No  . Drug use: No  . Sexual activity: Not Asked   Other Topics Concern  . None   Social History Narrative  . None     Family History:  The patient's family history is not  on file.   ROS:   Please see the history of present illness.    Review of Systems  Respiratory: Positive for snoring.    All other systems reviewed and are negative.  No flowsheet data found.     PHYSICAL EXAM:   VS:  Pulse 80   Ht 5\' 5"  (1.651 m)   Wt 222 lb 12.8 oz (101.1 kg)   SpO2 96%   BMI 37.08 kg/m    GEN: Well nourished, well developed, in no acute distress  HEENT: normal  Neck: no JVD, carotid bruits, or masses Cardiac: RRR; no murmurs, rubs, or gallops.  Marked LE edema R>L.  Intact distal pulses bilaterally.  Respiratory:  clear to auscultation bilaterally, normal work of breathing GI: soft, nontender, nondistended, + BS MS: no deformity or atrophy  Skin: marked erythema of the RLE c/w cellulitis Neuro:  Alert and Oriented x 3,  Strength and sensation are intact Psych: euthymic mood, full affect  Wt Readings from Last 3 Encounters:  10/06/16 222 lb 12.8 oz (101.1 kg)  09/23/14 238 lb 3.2 oz (108 kg)  01/15/14 231 lb 3.2 oz (104.9 kg)      Studies/Labs Reviewed:   EKG:  EKG is  ordered today.  The ekg ordered today demonstrates NSR at 76bpm with no ST changes  Recent Labs: No results found for requested labs within last 8760 hours.   Lipid Panel    Component Value Date/Time   CHOL 134 05/20/2013 0854   TRIG 157.0 (H) 05/20/2013 0854   HDL 35.00 (L) 05/20/2013 0854   CHOLHDL 4 05/20/2013 0854   VLDL 31.4 05/20/2013 0854   LDLCALC 68 05/20/2013 0854    Additional studies/ records that were reviewed today include:  none    ASSESSMENT:    1. Atherosclerosis of native coronary artery of native heart without angina pectoris   2. Chronic diastolic CHF (congestive heart failure) (HCC)   3. Essential hypertension, benign   4. Pure hypercholesterolemia   5. Cellulitis of right lower extremity      PLAN:  In order of problems listed above:  1. ASCAD s/p remote CABG.  She has no angina symptoms. She will continue on ASA 81mg  daily, statin and BB.  2. Chronic diastolic CHF - she appear euvolemic on exam and weight is down but the only thing I have to compare is 2016 weight.   She has marked LE edema but lungs are clear.  Difficult to assess JVD.  I will send her to ER for evaluation.  She will need BMET and likely started on IV lasix for marked LE edema  3. HTN - Her BP is controlled on exam today.  She will continue on Lisinopril 20mg  daily and Toprol XL 100mg  daily. She needs a BMET check to make sure renal function is stable on ACE I.  4. Hyperlipidemia with LDL goal < 70.  She will continue on Crestor 20mg  daily.  I will get a copy of last FLP and ALT from PCP.  5. Cellulitis of the RLE - her leg is very swollen and very erythematous c/w infection.  She has had nausea as well.  I thinks she needs  to go to ER to be evaluated further with WBC and possibly LE venous doppler to rule out DVT. I will send her to the ER with her son who is with her today.    Medication Adjustments/Labs and Tests Ordered: Current medicines are reviewed at length with the patient today.  Concerns regarding medicines are outlined above.  Medication changes, Labs and Tests ordered today are listed in the Patient Instructions below.  There are no Patient Instructions on file for this visit.   Signed, Armanda Magic, MD  10/06/2016 3:56 PM    [facility] [address] [place], [place], [place]  [address] Phone: [phone]; Fax: [phone]

## 2016-10-06 NOTE — Discharge Instructions (Signed)
As discussed, the blood clot study is negative. However, you do have infection in the leg, and so we are giving you antibiotics for that.  Additionally, your cardiac enzyme is slightly elevated. As we discussed that could mean that you are having a small heart attack, or it could mean your heart is under strain due to effects from other parts of your body. You have decided to go home, but we need you to see Cardiologist on Monday.  Please return to the ER if you have worsening chest pain, worsening shortness of breath, pain radiating to your jaw, shoulder, or back, sweats or fainting. Otherwise see the Cardiologist or your primary care doctor as requested.

## 2016-10-07 LAB — D-DIMER, QUANTITATIVE (NOT AT ARMC): D DIMER QUANT: 1.4 ug{FEU}/mL — AB (ref 0.00–0.50)

## 2016-10-09 ENCOUNTER — Telehealth: Payer: Self-pay | Admitting: Cardiology

## 2016-10-09 NOTE — Telephone Encounter (Signed)
F/u appt    pt daughter call to f/u up on schedule echo for today. Please call back to discuss

## 2016-10-09 NOTE — Telephone Encounter (Signed)
-----   Message from Quintella Reichert, MD sent at 10/07/2016  8:45 PM EDT ----- Regarding: RE: NSTEMI patient Thank you for letting me know.    Katy, Please get this patient in to see an extender next week.   Traci ----- Message ----- From: Rosario Jacks, MD Sent: 10/06/2016  11:08 PM To: Quintella Reichert, MD Subject: NSTEMI patient                                 Hello Dr. Mayford Knife, I am one of the overnight cardiology moonlighters and wanted to let you know about one of your patients who showed up to the ED today. Brittney Lopez came in from your clinic for evaluation of lower extremity erythema concerning for cellulitis. She endorsed shortness of breath with moderate exertion that has been stable for months to years. She has no chest pain and she has no signs of decompensated heart failure.   The triage nurse checked her troponin and found it to be elevated to 0.11. She has no renal dysfunction and no prior troponins in our system. She had several other troponins checked over the course of several hours and this remained stable at 0.11. We discussed that the safest course of action would be for her to get admitted and have an echo in the AM to evaluate for new WMA's. After discussing with her daughter however the patient decided that she wanted to go home and would call your office on Monday to discuss how to move forward. I realize that this is somewhat non-standard management of a patient with known CAD presenting with positive troponin, however the patient was adamant about going home. I just wanted to let you know so you can have someone reach out to her this week. Thank you

## 2016-10-09 NOTE — Telephone Encounter (Signed)
New message    Pt daughter is calling because pt was discharged from ER and told she needed an echo. There is no order. Pt daughter is asking for a call back.

## 2016-10-09 NOTE — Telephone Encounter (Signed)
Scheduled patient tomorrow at 1400 with B. Bhagat, PA for evaluation.  She understands to proceed back to ED if symptoms worsen prior to appointment. DPR agrees with treatment plan.

## 2016-10-10 ENCOUNTER — Inpatient Hospital Stay (HOSPITAL_COMMUNITY)
Admission: AD | Admit: 2016-10-10 | Discharge: 2016-10-18 | DRG: 291 | Disposition: A | Discharge status: Hospice | Payer: Medicare Other | Source: Ambulatory Visit | Attending: Cardiology | Admitting: Cardiology

## 2016-10-10 ENCOUNTER — Ambulatory Visit (INDEPENDENT_AMBULATORY_CARE_PROVIDER_SITE_OTHER): Payer: Medicare Other | Admitting: Physician Assistant

## 2016-10-10 ENCOUNTER — Encounter (HOSPITAL_COMMUNITY): Payer: Self-pay

## 2016-10-10 ENCOUNTER — Encounter: Payer: Self-pay | Admitting: Physician Assistant

## 2016-10-10 VITALS — BP 126/80 | HR 65 | Ht 61.0 in | Wt 220.8 lb

## 2016-10-10 DIAGNOSIS — E10628 Type 1 diabetes mellitus with other skin complications: Secondary | ICD-10-CM | POA: Diagnosis present

## 2016-10-10 DIAGNOSIS — Z951 Presence of aortocoronary bypass graft: Secondary | ICD-10-CM

## 2016-10-10 DIAGNOSIS — I2510 Atherosclerotic heart disease of native coronary artery without angina pectoris: Secondary | ICD-10-CM | POA: Diagnosis present

## 2016-10-10 DIAGNOSIS — I5033 Acute on chronic diastolic (congestive) heart failure: Secondary | ICD-10-CM

## 2016-10-10 DIAGNOSIS — E1022 Type 1 diabetes mellitus with diabetic chronic kidney disease: Secondary | ICD-10-CM | POA: Diagnosis present

## 2016-10-10 DIAGNOSIS — I5084 End stage heart failure: Secondary | ICD-10-CM | POA: Diagnosis present

## 2016-10-10 DIAGNOSIS — E11628 Type 2 diabetes mellitus with other skin complications: Secondary | ICD-10-CM

## 2016-10-10 DIAGNOSIS — Z6841 Body Mass Index (BMI) 40.0 and over, adult: Secondary | ICD-10-CM

## 2016-10-10 DIAGNOSIS — L03115 Cellulitis of right lower limb: Secondary | ICD-10-CM

## 2016-10-10 DIAGNOSIS — I428 Other cardiomyopathies: Secondary | ICD-10-CM | POA: Diagnosis present

## 2016-10-10 DIAGNOSIS — N183 Chronic kidney disease, stage 3 (moderate): Secondary | ICD-10-CM | POA: Diagnosis present

## 2016-10-10 DIAGNOSIS — Z7982 Long term (current) use of aspirin: Secondary | ICD-10-CM

## 2016-10-10 DIAGNOSIS — E876 Hypokalemia: Secondary | ICD-10-CM | POA: Diagnosis not present

## 2016-10-10 DIAGNOSIS — I5043 Acute on chronic combined systolic (congestive) and diastolic (congestive) heart failure: Secondary | ICD-10-CM | POA: Diagnosis present

## 2016-10-10 DIAGNOSIS — Z515 Encounter for palliative care: Secondary | ICD-10-CM

## 2016-10-10 DIAGNOSIS — I25708 Atherosclerosis of coronary artery bypass graft(s), unspecified, with other forms of angina pectoris: Secondary | ICD-10-CM | POA: Diagnosis not present

## 2016-10-10 DIAGNOSIS — E103599 Type 1 diabetes mellitus with proliferative diabetic retinopathy without macular edema, unspecified eye: Secondary | ICD-10-CM | POA: Diagnosis present

## 2016-10-10 DIAGNOSIS — M7989 Other specified soft tissue disorders: Secondary | ICD-10-CM

## 2016-10-10 DIAGNOSIS — I130 Hypertensive heart and chronic kidney disease with heart failure and stage 1 through stage 4 chronic kidney disease, or unspecified chronic kidney disease: Principal | ICD-10-CM | POA: Diagnosis present

## 2016-10-10 DIAGNOSIS — I10 Essential (primary) hypertension: Secondary | ICD-10-CM | POA: Diagnosis present

## 2016-10-10 DIAGNOSIS — Z79899 Other long term (current) drug therapy: Secondary | ICD-10-CM

## 2016-10-10 DIAGNOSIS — F0390 Unspecified dementia without behavioral disturbance: Secondary | ICD-10-CM | POA: Diagnosis present

## 2016-10-10 DIAGNOSIS — I272 Pulmonary hypertension, unspecified: Secondary | ICD-10-CM | POA: Diagnosis present

## 2016-10-10 DIAGNOSIS — L304 Erythema intertrigo: Secondary | ICD-10-CM

## 2016-10-10 DIAGNOSIS — L0390 Cellulitis, unspecified: Secondary | ICD-10-CM | POA: Diagnosis present

## 2016-10-10 DIAGNOSIS — N179 Acute kidney failure, unspecified: Secondary | ICD-10-CM

## 2016-10-10 DIAGNOSIS — E7800 Pure hypercholesterolemia, unspecified: Secondary | ICD-10-CM | POA: Diagnosis present

## 2016-10-10 DIAGNOSIS — Z7189 Other specified counseling: Secondary | ICD-10-CM

## 2016-10-10 DIAGNOSIS — Z66 Do not resuscitate: Secondary | ICD-10-CM | POA: Diagnosis present

## 2016-10-10 DIAGNOSIS — I248 Other forms of acute ischemic heart disease: Secondary | ICD-10-CM | POA: Diagnosis present

## 2016-10-10 LAB — CBC
HEMATOCRIT: 40.3 % (ref 36.0–46.0)
Hemoglobin: 12.9 g/dL (ref 12.0–15.0)
MCH: 29.9 pg (ref 26.0–34.0)
MCHC: 32 g/dL (ref 30.0–36.0)
MCV: 93.3 fL (ref 78.0–100.0)
Platelets: 241 10*3/uL (ref 150–400)
RBC: 4.32 MIL/uL (ref 3.87–5.11)
RDW: 15.1 % (ref 11.5–15.5)
WBC: 8.2 10*3/uL (ref 4.0–10.5)

## 2016-10-10 LAB — COMPREHENSIVE METABOLIC PANEL
ALK PHOS: 49 U/L (ref 38–126)
ALT: 20 U/L (ref 14–54)
ANION GAP: 10 (ref 5–15)
AST: 28 U/L (ref 15–41)
Albumin: 3.8 g/dL (ref 3.5–5.0)
BUN: 19 mg/dL (ref 6–20)
CALCIUM: 8.7 mg/dL — AB (ref 8.9–10.3)
CO2: 28 mmol/L (ref 22–32)
Chloride: 103 mmol/L (ref 101–111)
Creatinine, Ser: 1.58 mg/dL — ABNORMAL HIGH (ref 0.44–1.00)
GFR calc non Af Amer: 29 mL/min — ABNORMAL LOW (ref >60)
GFR, EST AFRICAN AMERICAN: 33 mL/min — AB (ref >60)
Glucose, Bld: 145 mg/dL — ABNORMAL HIGH (ref 65–99)
Potassium: 3.6 mmol/L (ref 3.5–5.1)
SODIUM: 141 mmol/L (ref 135–145)
Total Bilirubin: 0.8 mg/dL (ref 0.3–1.2)
Total Protein: 6.5 g/dL (ref 6.5–8.1)

## 2016-10-10 LAB — BRAIN NATRIURETIC PEPTIDE: B NATRIURETIC PEPTIDE 5: 3937.3 pg/mL — AB (ref 0.0–100.0)

## 2016-10-10 MED ORDER — ACETAMINOPHEN 325 MG PO TABS
650.0000 mg | ORAL_TABLET | ORAL | Status: DC | PRN
  Administered 2016-10-14: 650 mg via ORAL
  Filled 2016-10-10: qty 2

## 2016-10-10 MED ORDER — METOPROLOL SUCCINATE ER 100 MG PO TB24
100.0000 mg | ORAL_TABLET | Freq: Every day | ORAL | Status: DC
  Administered 2016-10-11 – 2016-10-15 (×5): 100 mg via ORAL
  Filled 2016-10-10 (×6): qty 1

## 2016-10-10 MED ORDER — ONDANSETRON HCL 4 MG/2ML IJ SOLN: 4.0000 mg | Freq: Four times a day (QID) | INTRAMUSCULAR | Status: DC | PRN

## 2016-10-10 MED ORDER — LISINOPRIL 20 MG PO TABS: 20.0000 mg | ORAL_TABLET | Freq: Every day | ORAL | Status: DC

## 2016-10-10 MED ORDER — ONE-DAILY MULTI VITAMINS PO TABS: 1.0000 | ORAL_TABLET | Freq: Every day | ORAL | Status: DC

## 2016-10-10 MED ORDER — ASPIRIN 81 MG PO TABS: 81.0000 mg | ORAL_TABLET | Freq: Every day | ORAL | Status: DC

## 2016-10-10 MED ORDER — FUROSEMIDE 10 MG/ML IJ SOLN
60.0000 mg | Freq: Two times a day (BID) | INTRAMUSCULAR | Status: DC
  Administered 2016-10-10: 60 mg via INTRAVENOUS
  Filled 2016-10-10: qty 6

## 2016-10-10 MED ORDER — ADULT MULTIVITAMIN W/MINERALS CH
1.0000 | ORAL_TABLET | Freq: Every day | ORAL | Status: DC
  Administered 2016-10-11 – 2016-10-18 (×8): 1 via ORAL
  Filled 2016-10-10 (×8): qty 1

## 2016-10-10 MED ORDER — LINAGLIPTIN 5 MG PO TABS
5.0000 mg | ORAL_TABLET | Freq: Every day | ORAL | Status: DC
  Administered 2016-10-11 – 2016-10-18 (×8): 5 mg via ORAL
  Filled 2016-10-10 (×8): qty 1

## 2016-10-10 MED ORDER — HEPARIN SODIUM (PORCINE) 5000 UNIT/ML IJ SOLN
5000.0000 [IU] | Freq: Three times a day (TID) | INTRAMUSCULAR | Status: DC
  Administered 2016-10-10 – 2016-10-18 (×17): 5000 [IU] via SUBCUTANEOUS
  Filled 2016-10-10 (×19): qty 1

## 2016-10-10 MED ORDER — NITROGLYCERIN 0.4 MG SL SUBL: 0.4000 mg | SUBLINGUAL_TABLET | SUBLINGUAL | Status: DC | PRN

## 2016-10-10 MED ORDER — ASPIRIN EC 81 MG PO TBEC
81.0000 mg | DELAYED_RELEASE_TABLET | Freq: Every day | ORAL | Status: DC
  Administered 2016-10-11 – 2016-10-18 (×8): 81 mg via ORAL
  Filled 2016-10-10 (×8): qty 1

## 2016-10-10 MED ORDER — ROSUVASTATIN CALCIUM 10 MG PO TABS
10.0000 mg | ORAL_TABLET | Freq: Every day | ORAL | Status: DC
  Administered 2016-10-10 – 2016-10-18 (×9): 10 mg via ORAL
  Filled 2016-10-10 (×9): qty 1

## 2016-10-10 NOTE — Progress Notes (Signed)
Cardiology Office Note    Date:  10/10/2016   ID:  Brittney Lopez, DOB 1928-09-06, MRN [id number]  PCP:  Brittney Brunette, MD  Cardiologist:  Dr. Mayford Lopez  Chief Complaint: ER follow up for SOB and elevated troponin   History of Present Illness:   Brittney Lopez is a 83 y.o. female  history of ASCAD s/p remote CABG, HTN, dyslipidemia, chronic diastolic CHF,  CKD stage III, LE edema and HLD Presents for ER follow up.   Last echocardiogram 08/2013 showed normal LV function to 55-60% and grade 1 diastolic dysfunction.  Started on abx by Dr. Mayford Lopez for RLE cellulitis and advised to go ER where his troponin was 0.11. Repeat level remained stable at 0.11 x 3. Elevated d-dimer. No DVT on doppler. BNP of 4245. Recommended admission however decided to go home.   Here for follow up. He continues to have increasing lower extremity edema despite on increased Lasix po 40mg  for past 3 days. She also has dyspnea. No orthopnea, PND or syncope. Compliant with low sodium diet.   Past Medical History:  Diagnosis Date  . Chronic diastolic CHF (congestive heart failure) (HCC)    grade II diastolic dysfunction on echo 08/2012   . CKD (chronic kidney disease), stage III   . Coronary artery disease    s/p CABG  . DM (diabetes mellitus) type I controlled with eye manifestation (HCC)    diabetic proliferative retinopathy  . Dyslipidemia   . Hypertension   . Ischemic cardiomyopathy    Resolved with EF 60% by Echo 2007  . Memory loss    MIld,MMSE 12/19/11 difficulty clock drawing,normal animal fluency  . Obesity   . Pulmonary HTN (HCC)    mild with PASP 40-30mmHg echo 08/2012 and resolved by echo 2015    Past Surgical History:  Procedure Laterality Date  . CORONARY ARTERY BYPASS GRAFT      Current Medications:  Prior to Admission medications   Medication Sig Start Date End Date Taking? Authorizing Provider  aspirin 81 MG tablet Take 81 mg by mouth daily.   Yes [provider]    cephALEXin (KEFLEX) 500 MG capsule Take 1 capsule (500 mg total) by mouth 2 (two) times daily. 10/06/16  Yes Brittney Kaplan, MD  Cyanocobalamin (VITAMIN B-12) 5000 MCG SUBL Place 5,000 mcg under the tongue daily.   Yes [provider]  furosemide (LASIX) 20 MG tablet Take 20 mg by mouth daily.   Yes [provider]  lisinopril (PRINIVIL,ZESTRIL) 20 MG tablet Take 20 mg by mouth daily.   Yes [provider]  metoprolol succinate (TOPROL-XL) 100 MG 24 hr tablet Take 100 mg by mouth daily. Take with or immediately following a meal.   Yes [provider]  Multiple Vitamin (MULTIVITAMIN) tablet Take 1 tablet by mouth daily.   Yes [provider]  rosuvastatin (CRESTOR) 10 MG tablet Take 10 mg by mouth daily.   Yes [provider]  saxagliptin HCl (ONGLYZA) 5 MG TABS tablet Take 5 mg by mouth daily.   Yes [provider]  sulfamethoxazole-trimethoprim (BACTRIM DS,SEPTRA DS) 800-160 MG tablet Take 1 tablet by mouth 2 (two) times daily. 10/06/16 10/13/16 Yes Brittney Kaplan, MD  Vitamin D, Ergocalciferol, (DRISDOL) 50000 UNITS CAPS capsule Take 50,000 Units by mouth. 50,000 units Twice a week   Yes [provider]    Allergies:   Patient has no known allergies.   Social History   Social History  . Marital status: Divorced  Spouse name: N/A  . Number of children: 4  . Years of education: 12   Occupational History  . retired    Social History Main Topics  . Smoking status: Never Smoker  . Smokeless tobacco: Never Used  . Alcohol use No  . Drug use: No  . Sexual activity: Not Asked   Other Topics Concern  . None   Social History Narrative  . None     Family History:  The patient's family history is not on file.   ROS:   Please see the history of present illness.    ROS All other systems reviewed and are negative.   PHYSICAL EXAM:   VS:  BP 126/80   Pulse 65   Ht 5\' 1"  (1.549 m)   Wt 220 lb 12.8 oz (100.2 kg)    BMI 41.72 kg/m    GEN: Well nourished, well developed, in no acute distress  HEENT: normal  Neck: no JVD, carotid bruits, or masses Cardiac: RRR; no murmurs, rubs, or gallops,3 + BL LE eedema amd R leg erythema Respiratory:  clear to auscultation bilaterally, normal work of breathing GI: soft, nontender, nondistended, + BS MS: no deformity or atrophy  Skin: warm and dry, no rash Neuro:  Alert and Oriented x 3, Strength and sensation are intact Psych: euthymic mood, full affect  Wt Readings from Last 3 Encounters:  10/10/16 220 lb 12.8 oz (100.2 kg)  10/06/16 222 lb 12.8 oz (101.1 kg)  09/23/14 238 lb 3.2 oz (108 kg)      Studies/Labs Reviewed:   EKG:  EKG is not ordered today.   Recent Labs: 10/06/2016: B Natriuretic Peptide 4,245.0; BUN 23; Creatinine, Ser 1.06; Hemoglobin 13.1; Platelets 237; Potassium 3.8; Sodium 143   Lipid Panel    Component Value Date/Time   CHOL 134 05/20/2013 0854   TRIG 157.0 (H) 05/20/2013 0854   HDL 35.00 (L) 05/20/2013 0854   CHOLHDL 4 05/20/2013 0854   VLDL 31.4 05/20/2013 0854   LDLCALC 68 05/20/2013 0854    Additional studies/ records that were reviewed today include:   As above    ASSESSMENT & PLAN:    1. Acute on chronic diastolic CHF - BNP was >4000 in ER. She has failed outpatient diuretic therapy. Will admit directly for IV diuresis with IV lasix 60mg  BID. uptitrate as needed. Strict I & O and daily weight.  Get echo, CBC and CMP.   2. CAD s/p remote CABG - Seems elevated troponin in the ER is due to demand ischemia from acute CHF. No chest pain or dyspnea.   3. Right leg cellulitis - No improvement on outpatient therapy with Ketflex and BACTRIM. Will IM consult of IV Abx.    Medication Adjustments/Labs and Tests Ordered: Current medicines are reviewed at length with the patient today.  Concerns regarding medicines are outlined above.  Medication changes, Labs and Tests ordered today are listed in the Patient Instructions  below. Patient Instructions    You are being direct admitted to [facility]. Go to the main entrance, where they offer valet parking, and go through the double doors and up the hall, admitting is on the right.  We hope you feel better soon.         Lorelei Pont, [place]  10/10/2016 2:46 PM    [facility] [address] [place], [place], [place]  [address] Phone: [phone]; Fax: [phone]

## 2016-10-10 NOTE — H&P (Signed)
Cardiology Office Note    Date:  10/10/2016   ID:  Brittney Lopez, DOB August 10, 1928, MRN [id number]  PCP:  Brittney Brunette, MD     Cardiologist:  Dr. Mayford Lopez  Chief Complaint: ER follow up for SOB and elevated troponin   History of Present Illness:   Brittney Lopez is a 83 y.o. female history of ASCAD s/p remote CABG, HTN, dyslipidemia, chronic diastolic CHF, CKD stage III, LE edema and HLD Presents for ER follow up.   Last echocardiogram 08/2013 showed normal LV function to 55-60% and grade 1 diastolic dysfunction.  Started on abx by Dr. Mayford Lopez for RLE cellulitis and advised to go ER where his troponin was 0.11. Repeat level remained stable at 0.11 x 3. Elevated d-dimer. No DVT on doppler. BNP of 4245. Recommended admission however decided to go home.   Here for follow up. He continues to have increasing lower extremity edema despite on increased Lasix po 40mg  for past 3 days. She also has dyspnea. No orthopnea, PND or syncope. Compliant with low sodium diet.       Past Medical History:  Diagnosis Date  . Chronic diastolic CHF (congestive heart failure) (HCC)    grade II diastolic dysfunction on echo 08/2012   . CKD (chronic kidney disease), stage III   . Coronary artery disease    s/p CABG  . DM (diabetes mellitus) type I controlled with eye manifestation (HCC)    diabetic proliferative retinopathy  . Dyslipidemia   . Hypertension   . Ischemic cardiomyopathy    Resolved with EF 60% by Echo 2007  . Memory loss    MIld,MMSE 12/19/11 difficulty clock drawing,normal animal fluency  . Obesity   . Pulmonary HTN (HCC)    mild with PASP 40-62mmHg echo 08/2012 and resolved by echo 2015         Past Surgical History:  Procedure Laterality Date  . CORONARY ARTERY BYPASS GRAFT      Current Medications:         Prior to Admission medications   Medication Sig Start Date End Date Taking? Authorizing Provider  aspirin 81 MG tablet Take 81 mg by  mouth daily.   Yes [provider]  cephALEXin (KEFLEX) 500 MG capsule Take 1 capsule (500 mg total) by mouth 2 (two) times daily. 10/06/16  Yes Brittney Kaplan, MD  Cyanocobalamin (VITAMIN B-12) 5000 MCG SUBL Place 5,000 mcg under the tongue daily.   Yes [provider]  furosemide (LASIX) 20 MG tablet Take 20 mg by mouth daily.   Yes [provider]  lisinopril (PRINIVIL,ZESTRIL) 20 MG tablet Take 20 mg by mouth daily.   Yes [provider]  metoprolol succinate (TOPROL-XL) 100 MG 24 hr tablet Take 100 mg by mouth daily. Take with or immediately following a meal.   Yes [provider]  Multiple Vitamin (MULTIVITAMIN) tablet Take 1 tablet by mouth daily.   Yes [provider]  rosuvastatin (CRESTOR) 10 MG tablet Take 10 mg by mouth daily.   Yes [provider]  saxagliptin HCl (ONGLYZA) 5 MG TABS tablet Take 5 mg by mouth daily.   Yes [provider]  sulfamethoxazole-trimethoprim (BACTRIM DS,SEPTRA DS) 800-160 MG tablet Take 1 tablet by mouth 2 (two) times daily. 10/06/16 10/13/16 Yes Brittney Kaplan, MD  Vitamin D, Ergocalciferol, (DRISDOL) 50000 UNITS CAPS capsule Take 50,000 Units by mouth. 50,000 units Twice a week   Yes [provider]    Allergies:   Patient has no known  allergies.   Social History        Social History  . Marital status: Divorced    Spouse name: N/A  . Number of children: 4  . Years of education: 12       Occupational History  . retired        Social History Main Topics  . Smoking status: Never Smoker  . Smokeless tobacco: Never Used  . Alcohol use No  . Drug use: No  . Sexual activity: Not Asked       Other Topics Concern  . None      Social History Narrative  . None     Family History:  The patient's family history is not on file.   ROS:   Please see the history of present illness.    ROS All other systems reviewed and are  negative.   PHYSICAL EXAM:   VS:  BP 126/80   Pulse 65   Ht 5\' 1"  (1.549 m)   Wt 220 lb 12.8 oz (100.2 kg)   BMI 41.72 kg/m    GEN: Well nourished, well developed, in no acute distress  HEENT: normal  Neck: no JVD, carotid bruits, or masses Cardiac: RRR; no murmurs, rubs, or gallops,3 + BL LE eedema amd R leg erythema Respiratory:  clear to auscultation bilaterally, normal work of breathing GI: soft, nontender, nondistended, + BS MS: no deformity or atrophy  Skin: warm and dry, no rash Neuro:  Alert and Oriented x 3, Strength and sensation are intact Psych: euthymic mood, full affect     Wt Readings from Last 3 Encounters:  10/10/16 220 lb 12.8 oz (100.2 kg)  10/06/16 222 lb 12.8 oz (101.1 kg)  09/23/14 238 lb 3.2 oz (108 kg)      Studies/Labs Reviewed:   EKG:  EKG is not ordered today.   Recent Labs: 10/06/2016: B Natriuretic Peptide 4,245.0; BUN 23; Creatinine, Ser 1.06; Hemoglobin 13.1; Platelets 237; Potassium 3.8; Sodium 143   Lipid Panel Labs (Brief)          Component Value Date/Time   CHOL 134 05/20/2013 0854   TRIG 157.0 (H) 05/20/2013 0854   HDL 35.00 (L) 05/20/2013 0854   CHOLHDL 4 05/20/2013 0854   VLDL 31.4 05/20/2013 0854   LDLCALC 68 05/20/2013 0854      Additional studies/ records that were reviewed today include:   As above    ASSESSMENT & PLAN:     1. Acute on chronic diastolic CHF - BNP was >4000 in ER. She has failed outpatient diuretic therapy. Will admit directly for IV diuresis with IV lasix 60mg  BID. uptitrate as needed. Strict I & O and daily weight.  Get echo, CBC and CMP.   2. CAD s/p remote CABG - Seems elevated troponin in the ER is due to demand ischemia from acute CHF. No chest pain or dyspnea.   3. Right leg cellulitis - No improvement on outpatient therapy with Ketflex and BACTRIM. Will IM consult of IV Abx.    Brittney Lopez- PAC

## 2016-10-10 NOTE — Patient Instructions (Addendum)
   You are being direct admitted to [facility]. Go to the main entrance, where they offer valet parking, and go through the double doors and up the hall, admitting is on the right.  We hope you feel better soon.

## 2016-10-11 ENCOUNTER — Observation Stay (HOSPITAL_COMMUNITY): Payer: Medicare Other

## 2016-10-11 ENCOUNTER — Other Ambulatory Visit (HOSPITAL_COMMUNITY): Payer: Medicare Other

## 2016-10-11 DIAGNOSIS — Z7189 Other specified counseling: Secondary | ICD-10-CM | POA: Diagnosis not present

## 2016-10-11 DIAGNOSIS — Z951 Presence of aortocoronary bypass graft: Secondary | ICD-10-CM | POA: Diagnosis not present

## 2016-10-11 DIAGNOSIS — L304 Erythema intertrigo: Secondary | ICD-10-CM | POA: Diagnosis not present

## 2016-10-11 DIAGNOSIS — N179 Acute kidney failure, unspecified: Secondary | ICD-10-CM | POA: Diagnosis present

## 2016-10-11 DIAGNOSIS — E11628 Type 2 diabetes mellitus with other skin complications: Secondary | ICD-10-CM | POA: Diagnosis not present

## 2016-10-11 DIAGNOSIS — I340 Nonrheumatic mitral (valve) insufficiency: Secondary | ICD-10-CM | POA: Diagnosis not present

## 2016-10-11 DIAGNOSIS — I5041 Acute combined systolic (congestive) and diastolic (congestive) heart failure: Secondary | ICD-10-CM | POA: Diagnosis not present

## 2016-10-11 DIAGNOSIS — Z7982 Long term (current) use of aspirin: Secondary | ICD-10-CM | POA: Diagnosis not present

## 2016-10-11 DIAGNOSIS — E10628 Type 1 diabetes mellitus with other skin complications: Secondary | ICD-10-CM | POA: Diagnosis present

## 2016-10-11 DIAGNOSIS — I272 Pulmonary hypertension, unspecified: Secondary | ICD-10-CM | POA: Diagnosis present

## 2016-10-11 DIAGNOSIS — I5033 Acute on chronic diastolic (congestive) heart failure: Secondary | ICD-10-CM

## 2016-10-11 DIAGNOSIS — Z66 Do not resuscitate: Secondary | ICD-10-CM | POA: Diagnosis present

## 2016-10-11 DIAGNOSIS — M7989 Other specified soft tissue disorders: Secondary | ICD-10-CM

## 2016-10-11 DIAGNOSIS — E103599 Type 1 diabetes mellitus with proliferative diabetic retinopathy without macular edema, unspecified eye: Secondary | ICD-10-CM | POA: Diagnosis present

## 2016-10-11 DIAGNOSIS — E7800 Pure hypercholesterolemia, unspecified: Secondary | ICD-10-CM | POA: Diagnosis present

## 2016-10-11 DIAGNOSIS — E1022 Type 1 diabetes mellitus with diabetic chronic kidney disease: Secondary | ICD-10-CM | POA: Diagnosis present

## 2016-10-11 DIAGNOSIS — I5084 End stage heart failure: Secondary | ICD-10-CM | POA: Diagnosis present

## 2016-10-11 DIAGNOSIS — E876 Hypokalemia: Secondary | ICD-10-CM | POA: Diagnosis not present

## 2016-10-11 DIAGNOSIS — I130 Hypertensive heart and chronic kidney disease with heart failure and stage 1 through stage 4 chronic kidney disease, or unspecified chronic kidney disease: Secondary | ICD-10-CM | POA: Diagnosis present

## 2016-10-11 DIAGNOSIS — L03031 Cellulitis of right toe: Secondary | ICD-10-CM

## 2016-10-11 DIAGNOSIS — Z79899 Other long term (current) drug therapy: Secondary | ICD-10-CM | POA: Diagnosis not present

## 2016-10-11 DIAGNOSIS — I2510 Atherosclerotic heart disease of native coronary artery without angina pectoris: Secondary | ICD-10-CM | POA: Diagnosis present

## 2016-10-11 DIAGNOSIS — Z6841 Body Mass Index (BMI) 40.0 and over, adult: Secondary | ICD-10-CM | POA: Diagnosis not present

## 2016-10-11 DIAGNOSIS — N183 Chronic kidney disease, stage 3 (moderate): Secondary | ICD-10-CM | POA: Diagnosis present

## 2016-10-11 DIAGNOSIS — I5043 Acute on chronic combined systolic (congestive) and diastolic (congestive) heart failure: Secondary | ICD-10-CM | POA: Diagnosis present

## 2016-10-11 DIAGNOSIS — L03119 Cellulitis of unspecified part of limb: Secondary | ICD-10-CM | POA: Diagnosis not present

## 2016-10-11 DIAGNOSIS — I428 Other cardiomyopathies: Secondary | ICD-10-CM | POA: Diagnosis present

## 2016-10-11 DIAGNOSIS — R0600 Dyspnea, unspecified: Secondary | ICD-10-CM | POA: Diagnosis present

## 2016-10-11 DIAGNOSIS — L03115 Cellulitis of right lower limb: Secondary | ICD-10-CM | POA: Diagnosis present

## 2016-10-11 DIAGNOSIS — I248 Other forms of acute ischemic heart disease: Secondary | ICD-10-CM | POA: Diagnosis present

## 2016-10-11 DIAGNOSIS — Z515 Encounter for palliative care: Secondary | ICD-10-CM | POA: Diagnosis present

## 2016-10-11 DIAGNOSIS — F0390 Unspecified dementia without behavioral disturbance: Secondary | ICD-10-CM | POA: Diagnosis present

## 2016-10-11 LAB — BASIC METABOLIC PANEL
Anion gap: 13 (ref 5–15)
BUN: 20 mg/dL (ref 6–20)
CALCIUM: 8.5 mg/dL — AB (ref 8.9–10.3)
CO2: 27 mmol/L (ref 22–32)
CREATININE: 1.57 mg/dL — AB (ref 0.44–1.00)
Chloride: 102 mmol/L (ref 101–111)
GFR calc Af Amer: 34 mL/min — ABNORMAL LOW (ref >60)
GFR calc non Af Amer: 29 mL/min — ABNORMAL LOW (ref >60)
GLUCOSE: 103 mg/dL — AB (ref 65–99)
Potassium: 3.7 mmol/L (ref 3.5–5.1)
Sodium: 142 mmol/L (ref 135–145)

## 2016-10-11 LAB — GLUCOSE, CAPILLARY
GLUCOSE-CAPILLARY: 155 mg/dL — AB (ref 65–99)
GLUCOSE-CAPILLARY: 110 mg/dL — AB (ref 65–99)

## 2016-10-11 LAB — LIPID PANEL
Cholesterol: 63 mg/dL (ref 0–200)
HDL: 31 mg/dL — ABNORMAL LOW (ref >40)
LDL CALC: 13 mg/dL (ref 0–99)
Total CHOL/HDL Ratio: 2 RATIO
Triglycerides: 95 mg/dL (ref <150)
VLDL: 19 mg/dL (ref 0–40)

## 2016-10-11 MED ORDER — INSULIN ASPART 100 UNIT/ML ~~LOC~~ SOLN
0.0000 [IU] | Freq: Three times a day (TID) | SUBCUTANEOUS | Status: DC
  Administered 2016-10-12: 1 [IU] via SUBCUTANEOUS
  Administered 2016-10-13: 2 [IU] via SUBCUTANEOUS
  Administered 2016-10-14: 1 [IU] via SUBCUTANEOUS
  Administered 2016-10-14 – 2016-10-15 (×2): 2 [IU] via SUBCUTANEOUS
  Administered 2016-10-15 – 2016-10-16 (×2): 1 [IU] via SUBCUTANEOUS
  Administered 2016-10-16 (×2): 2 [IU] via SUBCUTANEOUS
  Administered 2016-10-17: 1 [IU] via SUBCUTANEOUS
  Administered 2016-10-17 – 2016-10-18 (×3): 2 [IU] via SUBCUTANEOUS
  Administered 2016-10-18: 1 [IU] via SUBCUTANEOUS

## 2016-10-11 MED ORDER — BACID PO TABS
2.0000 | ORAL_TABLET | Freq: Three times a day (TID) | ORAL | Status: DC
  Administered 2016-10-11 – 2016-10-18 (×21): 2 via ORAL
  Filled 2016-10-11 (×27): qty 2

## 2016-10-11 MED ORDER — CLINDAMYCIN PHOSPHATE 300 MG/50ML IV SOLN
300.0000 mg | Freq: Three times a day (TID) | INTRAVENOUS | Status: DC
  Administered 2016-10-11 – 2016-10-13 (×7): 300 mg via INTRAVENOUS
  Filled 2016-10-11 (×8): qty 50

## 2016-10-11 MED ORDER — PERFLUTREN LIPID MICROSPHERE
INTRAVENOUS | Status: AC
  Administered 2016-10-11: 3.5 mL via INTRAVENOUS
  Filled 2016-10-11: qty 10

## 2016-10-11 MED ORDER — INSULIN ASPART 100 UNIT/ML ~~LOC~~ SOLN: 0.0000 [IU] | Freq: Every day | SUBCUTANEOUS | Status: DC

## 2016-10-11 MED ORDER — NYSTATIN 100000 UNIT/GM EX OINT
TOPICAL_OINTMENT | Freq: Two times a day (BID) | CUTANEOUS | Status: DC
  Administered 2016-10-11 (×2): via TOPICAL
  Administered 2016-10-13: 1 via TOPICAL
  Administered 2016-10-13: 10:00:00 via TOPICAL
  Administered 2016-10-13: 1 via TOPICAL
  Administered 2016-10-14 – 2016-10-16 (×5): via TOPICAL
  Administered 2016-10-17: 1 via TOPICAL
  Administered 2016-10-17 – 2016-10-18 (×2): via TOPICAL
  Filled 2016-10-11 (×2): qty 15

## 2016-10-11 MED ORDER — INSULIN ASPART 100 UNIT/ML ~~LOC~~ SOLN
0.0000 [IU] | Freq: Three times a day (TID) | SUBCUTANEOUS | Status: DC
  Administered 2016-10-11: 2 [IU] via SUBCUTANEOUS

## 2016-10-11 MED ORDER — FUROSEMIDE 10 MG/ML IJ SOLN
60.0000 mg | Freq: Three times a day (TID) | INTRAMUSCULAR | Status: DC
  Administered 2016-10-11 – 2016-10-13 (×7): 60 mg via INTRAVENOUS
  Filled 2016-10-11 (×7): qty 6

## 2016-10-11 MED ORDER — PERFLUTREN LIPID MICROSPHERE
1.0000 mL | INTRAVENOUS | Status: AC | PRN
  Administered 2016-10-11: 3.5 mL via INTRAVENOUS
  Filled 2016-10-11: qty 10

## 2016-10-11 NOTE — Care Management Obs Status (Signed)
MEDICARE OBSERVATION STATUS NOTIFICATION   Patient Details  Name: Brittney Lopez MRN: [id number] Date of Birth: 05-19-1928   Medicare Observation Status Notification Given:  Yes    Lawerance Sabal, RN 10/11/2016, 5:03 PM

## 2016-10-11 NOTE — Consult Note (Signed)
Medical Consultation   Brittney Lopez  ZOX:[id number]  DOB: [birth year]  DOA: 10/10/2016  PCP: Merri Brunette, MD   Outpatient Specialists: Cardiology    Requesting physician: Dr Mayford Knife - cardiology  Reason for consultation: Cellulitis   History of Present Illness: Brittney Lopez is an 83 y.o. female who presents w/ a past medical history significant for CHF, CKD, CAD/CABG, DM, HLD, HTN, obesity, and Pulmonary HTN. Pt admitted on 10/10/16 for decompensated CHF by the cardiology team. They have consulted [facility] for assistance w/ RLE cellulitis. There was concern that she had failed outpt therapy w/ Keflex and Bactrim. At time of my exam pt endorses a several day history of right lower extremity progressive redness and tenderness and swelling. States that she was seen by her doctor on 10/06/2016 and was prescribed medicines to help with the infection but is unsure if she ever picked up the medicines and does not think that she ever started taking any extra medicine. Redness and pain are progressive and getting worse. Swelling is somewhat improved on this, her second day of admission and do large part to aggressive diuresis from CHF. Right lower extremity is considerably more edematous than left lower extremity. Patient denies fevers, chest pain, palpitations, dysuria, frequency, abdominal pain. Patient states that she also gets a rash in her abdominal skin folds from time to time.  Review of Systems:  ROS As per HPI otherwise all other systems reviewed and are negative   Past Medical History: Past Medical History:  Diagnosis Date  . Chronic diastolic CHF (congestive heart failure) (HCC)    grade II diastolic dysfunction on echo 08/2012   . CKD (chronic kidney disease), stage III   . Coronary artery disease    s/p CABG  . DM (diabetes mellitus) type I controlled with eye manifestation (HCC)    diabetic proliferative retinopathy  . Dyslipidemia   . Hypertension   .  Ischemic cardiomyopathy    Resolved with EF 60% by Echo 2007  . Memory loss    MIld,MMSE 12/19/11 difficulty clock drawing,normal animal fluency  . Obesity   . Pulmonary HTN (HCC)    mild with PASP 40-63mmHg echo 08/2012 and resolved by echo 2015    Past Surgical History: Past Surgical History:  Procedure Laterality Date  . CORONARY ARTERY BYPASS GRAFT       Allergies:  No Known Allergies   Social History:  reports that she has never smoked. She has never used smokeless tobacco. She reports that she does not drink alcohol or use drugs.   Family History: Family History  Problem Relation Age of Onset  . Other Unknown        PT STATES FM HAS NO HEALTH ISSUES     Physical Exam: Vitals:   10/10/16 1552 10/10/16 2206 10/11/16 0015 10/11/16 0413  BP: 132/70 135/73 134/70 138/69  Pulse: 67 82 75 73  Resp: 20 18 19 18   Temp: 98 F (36.7 C) 97.6 F (36.4 C) 97.7 F (36.5 C) 97.9 F (36.6 C)  TempSrc: Oral Oral Oral Oral  SpO2: 100% 100% 99% 100%  Weight: 99 kg (218 lb 4.8 oz)   98.2 kg (216 lb 6.4 oz)  Height: 5\' 1"  (1.549 m)       General:  Appears calm and comfortable Eyes:  PERRL, EOMI, normal lids, iris ENT:  grossly normal hearing, lips & tongue, mmm Neck:  no LAD, masses  or thyromegaly Cardiovascular:  Faint heart sounds, regular rate and rhythm, 3+ pitting edema in the right lower extremity and 2+ pitting edema in the left lower extremity  Respiratory:  CTA bilaterally, no w/r/r. Normal respiratory effort. Abdomen:  soft, ntnd, NABS Skin:  Right lower extremity with marked erythema, induration and tenderness to palpation from approximately the mid calf region to the distal ankle region. Left lower extremity with mild erythema but no significant induration or tenderness to palpation. Abdominal skin folds with dry scaly erythematous skin. Nontender. Musculoskeletal:  grossly normal tone BUE/BLE, good ROM, no bony abnormality Psychiatric:  grossly normal mood and  affect, speech fluent and appropriate, AOx3 Neurologic:  CN 2-12 grossly intact, moves all extremities in coordinated fashion, sensation intact  Data reviewed:  I have personally reviewed following labs and imaging studies Labs:  CBC:  Recent Labs Lab 10/06/16 1642 10/10/16 1836  WBC 8.9 8.2  HGB 13.1 12.9  HCT 41.2 40.3  MCV 93.8 93.3  PLT 237 241    Basic Metabolic Panel:  Recent Labs Lab 10/06/16 1642 10/10/16 1836 10/11/16 0453  NA 143 141 142  K 3.8 3.6 3.7  CL 108 103 102  CO2 24 28 27   GLUCOSE 152* 145* 103*  BUN 23* 19 20  CREATININE 1.06* 1.58* 1.57*  CALCIUM 8.6* 8.7* 8.5*   GFR Estimated Creatinine Clearance: 28.1 mL/min (A) (by C-G formula based on SCr of 1.57 mg/dL (H)). Liver Function Tests:  Recent Labs Lab 10/10/16 1836  AST 28  ALT 20  ALKPHOS 49  BILITOT 0.8  PROT 6.5  ALBUMIN 3.8   No results for input(s): LIPASE, AMYLASE in the last 168 hours. No results for input(s): AMMONIA in the last 168 hours. Coagulation profile No results for input(s): INR, PROTIME in the last 168 hours.  Cardiac Enzymes:  Recent Labs Lab 10/06/16 1844  TROPONINI 0.11*   BNP: Invalid input(s): POCBNP CBG: No results for input(s): GLUCAP in the last 168 hours. D-Dimer No results for input(s): DDIMER in the last 72 hours. Hgb A1c No results for input(s): HGBA1C in the last 72 hours. Lipid Profile  Recent Labs  10/11/16 0453  CHOL 63  HDL 31*  LDLCALC 13  TRIG 95  CHOLHDL 2.0   Thyroid function studies No results for input(s): TSH, T4TOTAL, T3FREE, THYROIDAB in the last 72 hours.  Invalid input(s): FREET3 Anemia work up No results for input(s): VITAMINB12, FOLATE, FERRITIN, TIBC, IRON, RETICCTPCT in the last 72 hours. Urinalysis No results found for: COLORURINE, APPEARANCEUR, LABSPEC, PHURINE, GLUCOSEU, HGBUR, BILIRUBINUR, KETONESUR, PROTEINUR, UROBILINOGEN, NITRITE, LEUKOCYTESUR   Microbiology No results found for this or any previous  visit (from the past 240 hour(s)).     Inpatient Medications:   Scheduled Meds: . aspirin EC  81 mg Oral Daily  . furosemide  60 mg Intravenous TID  . heparin  5,000 Units Subcutaneous Q8H  . insulin aspart  0-5 Units Subcutaneous QHS  . insulin aspart  0-9 Units Subcutaneous TID WC  . lactobacillus acidophilus  2 tablet Oral TID  . linagliptin  5 mg Oral Daily  . metoprolol succinate  100 mg Oral Daily  . multivitamin with minerals  1 tablet Oral Daily  . nystatin ointment   Topical BID  . rosuvastatin  10 mg Oral Daily   Continuous Infusions: . clindamycin (CLEOCIN) IV       Radiological Exams on Admission: No results found.  Impression/Recommendations Active Problems:   Cellulitis   Acute on chronic diastolic CHF (  congestive heart failure) (HCC)   Swelling of right lower extremity   Diabetes with skin complication (HCC)   Intertrigo   AKI (acute kidney injury) (HCC)  Acute on chronic diastolic congestive heart failure: Patient's primary admitting diagnoses. Patient appears to be responding well to diuresis. - Management per primary team, cardiology  Right lower extremity cellulitis: Doubt that patient never started her antibiotics in the outpatient setting. Patient appears to be a poor historian at baseline. No evidence of systemic infection at this time. Additionally cellulitis will be hard to fully cure until edema has resolved. - IV clindamycin  DM: on Onglyza as outpt. Started on tradjenta by primary team - A1c for baseline - SSI for optimal control. May need to stop tradjenta if becomes hypoglycemic.   AKI: cr 1.57. Baseline 1.0. This is to be expected given combination of cardiorenal syndrome and aggressive diuresis - Recommend daily BMP while diuresing and until downtrending.  Lower extremity edema: Right lower extremity significantly more swollen than left. This may be due in part to cellulitis the patient also endorsing increased swelling in that extremity  at baseline. No history of chest pain, palpitations or tachycardia. Patient appears to be somewhat sedentary lifestyle. - Right lower extremity venous duplex  Intertrigo: - Start nystatin    Thank you for this consultation.  Our [facility] hospitalist team will follow the patient with you.    MERRELL, DAVID J M.D. [facility] 10/11/2016, 8:12 AM

## 2016-10-11 NOTE — Progress Notes (Signed)
  Echocardiogram 2D Echocardiogram has been performed.  Brittney Lopez M 10/11/2016, 12:54 PM

## 2016-10-11 NOTE — Progress Notes (Signed)
Progress Note  Patient Name: Brittney Lopez Date of Encounter: 10/11/2016  Primary Cardiologist: Dr. Mayford Knife  Subjective   No chest pain, sob or palpitations.   Inpatient Medications    Scheduled Meds: . aspirin EC  81 mg Oral Daily  . furosemide  60 mg Intravenous BID  . heparin  5,000 Units Subcutaneous Q8H  . linagliptin  5 mg Oral Daily  . lisinopril  20 mg Oral Daily  . metoprolol succinate  100 mg Oral Daily  . multivitamin with minerals  1 tablet Oral Daily  . rosuvastatin  10 mg Oral Daily   Continuous Infusions:  PRN Meds: acetaminophen, nitroGLYCERIN, ondansetron (ZOFRAN) IV   Vital Signs    Vitals:   10/10/16 1552 10/10/16 2206 10/11/16 0015 10/11/16 0413  BP: 132/70 135/73 134/70 138/69  Pulse: 67 82 75 73  Resp: 20 18 19 18   Temp: 98 F (36.7 C) 97.6 F (36.4 C) 97.7 F (36.5 C) 97.9 F (36.6 C)  TempSrc: Oral Oral Oral Oral  SpO2: 100% 100% 99% 100%  Weight: 218 lb 4.8 oz (99 kg)   216 lb 6.4 oz (98.2 kg)  Height: 5\' 1"  (1.549 m)       Intake/Output Summary (Last 24 hours) at 10/11/16 0622 Last data filed at 10/11/16 0414  Gross per 24 hour  Intake              120 ml  Output              850 ml  Net             -730 ml   Filed Weights   10/10/16 1552 10/11/16 0413  Weight: 218 lb 4.8 oz (99 kg) 216 lb 6.4 oz (98.2 kg)    Telemetry    NSR - Personally Reviewed  ECG      Physical Exam   GEN: No acute distress.   Neck: No JVD Cardiac: RRR, no murmurs, rubs, or gallops. 2-3+ oozing bilateral LE edema. R leg with erythema. L leg wrapped.  Respiratory: Clear to auscultation bilaterally. GI: Soft, nontender, non-distended  MS: No edema; No deformity. Neuro:  Nonfocal  Psych: Normal affect   Labs    Chemistry Recent Labs Lab 10/06/16 1642 10/10/16 1836 10/11/16 0453  NA 143 141 142  K 3.8 3.6 3.7  CL 108 103 102  CO2 24 28 27   GLUCOSE 152* 145* 103*  BUN 23* 19 20  CREATININE 1.06* 1.58* 1.57*  CALCIUM 8.6* 8.7*  8.5*  PROT  --  6.5  --   ALBUMIN  --  3.8  --   AST  --  28  --   ALT  --  20  --   ALKPHOS  --  49  --   BILITOT  --  0.8  --   GFRNONAA 47* 29* 29*  GFRAA 54* 33* 34*  ANIONGAP 11 10 13      Hematology Recent Labs Lab 10/06/16 1642 10/10/16 1836  WBC 8.9 8.2  RBC 4.39 4.32  HGB 13.1 12.9  HCT 41.2 40.3  MCV 93.8 93.3  MCH 29.8 29.9  MCHC 31.8 32.0  RDW 15.0 15.1  PLT 237 241    Cardiac Enzymes Recent Labs Lab 10/06/16 1844  TROPONINI 0.11*    Recent Labs Lab 10/06/16 1652 10/06/16 1853 10/06/16 2224  TROPIPOC 0.12* 0.11* 0.11*     BNP Recent Labs Lab 10/06/16 2217 10/10/16 1836  BNP 4,245.0* 3,937.3*  DDimer  Recent Labs Lab 10/06/16 2217  DDIMER 1.40*     Radiology    No results found.  Cardiac Studies   Pending echo this admission  Patient Profile     83 y.o. female with history of ASCAD s/p remote CABG, HTN, dyslipidemia, chronic diastolic CHF, CKD stage III, LE edema and HLD admitted from clinic with acute dCHF and R LE cellulitis.   Seen by Dr. Mayford Knife in clinic 10/06/16 for LE edema/RLE cellulitis and advised to go ER where her troponin was 0.11. Repeat level remained stable at 0.11 x 3. Elevated d-dimer. No DVT on doppler. BNP of 4245. Recommended admission however decided to go home.   She continues to have severe LE edema despite increased Lasix po 40mg  for past 3 days during clinic visit 10/10/16. Admitted directly for IV diuresis and IV abx for cellulitis.   Assessment & Plan    1. Acute on chronic diastolic CHF - BNP was >4000 in ER. Repeat BNP yesterday showed 3937.  - Net I & O negative 730cc on IV Lasix 60mg  x 1. Scr stable at 1.5. Continue IV lasix 60mg  TID and BB. Will hold home lisinopril 20mg . Pending echo this admission.   2. CAD s/p remote CABG - Seems elevated troponin in the ER 6/1 is due to demand ischemia from acute CHF. No chest pain or dyspnea. Continue ASA and statin.   3. Right leg cellulitis - No  improvement on outpatient therapy with Ketflex and BACTRIM. Pending evaluation by IM this morning.   4. Acute on chronic kidney disease, stage III - Scr was 1.06 on 6/1. Now 1.5. Hold lisinopril. Watch renal function closely.   5. HTN - meds changes as above. Follow BP.   6. HLD - 10/11/2016: Cholesterol 63; HDL 31; LDL Cholesterol 13; Triglycerides 95; VLDL 19  - Continue statin. Stable LFTs.    Signed, Manson Passey, PA  10/11/2016, 6:22 AM

## 2016-10-12 DIAGNOSIS — I5043 Acute on chronic combined systolic (congestive) and diastolic (congestive) heart failure: Secondary | ICD-10-CM

## 2016-10-12 LAB — GLUCOSE, CAPILLARY
GLUCOSE-CAPILLARY: 147 mg/dL — AB (ref 65–99)
Glucose-Capillary: 115 mg/dL — ABNORMAL HIGH (ref 65–99)
Glucose-Capillary: 114 mg/dL — ABNORMAL HIGH (ref 65–99)
Glucose-Capillary: 132 mg/dL — ABNORMAL HIGH (ref 65–99)

## 2016-10-12 LAB — BASIC METABOLIC PANEL
ANION GAP: 12 (ref 5–15)
BUN: 19 mg/dL (ref 6–20)
CALCIUM: 8.3 mg/dL — AB (ref 8.9–10.3)
CHLORIDE: 99 mmol/L — AB (ref 101–111)
CO2: 30 mmol/L (ref 22–32)
Creatinine, Ser: 1.68 mg/dL — ABNORMAL HIGH (ref 0.44–1.00)
GFR calc non Af Amer: 27 mL/min — ABNORMAL LOW (ref >60)
GFR, EST AFRICAN AMERICAN: 31 mL/min — AB (ref >60)
Glucose, Bld: 116 mg/dL — ABNORMAL HIGH (ref 65–99)
Potassium: 4 mmol/L (ref 3.5–5.1)
SODIUM: 141 mmol/L (ref 135–145)

## 2016-10-12 LAB — ECHOCARDIOGRAM COMPLETE
HEIGHTINCHES: 61 in
WEIGHTICAEL: 3462.4 [oz_av]

## 2016-10-12 NOTE — Progress Notes (Addendum)
[facility] PROGRESS NOTE  Brittney Lopez RUE:[id number] DOB: Mar 31, 1929 DOA: 10/10/2016   PCP: Brittney Brunette, MD     Assessment/Plan: Active Problems:   Cellulitis   Acute on chronic diastolic CHF (congestive heart failure) (HCC)   Swelling of right lower extremity   Diabetes with skin complication (HCC)   Intertrigo   AKI (acute kidney injury) (HCC)   83 y.o. female who presents w/ a past medical history significant for CHF, CKD, CAD/CABG, DM, HLD, HTN, obesity, and Pulmonary HTN. Pt admitted on 10/10/16 for decompensated CHF by the cardiology team. They have consulted [facility] for assistance w/ RLE cellulitis. There was concern that she had failed outpt therapy w/ Keflex and Bactrim.  Assessment and plan Acute on chronic diastolic congestive heart failure: Patient's primary admitting diagnoses. Patient appears to be responding well to diuresis. - Management per primary team, cardiology  Right lower extremity cellulitis:   Patient appears to be a poor historian at baseline. No evidence of systemic infection at this time . white blood cell count within normal limits. Hopefully cellulitis will will improve with improved diuresis. Continue- IV clindamycin  DM: on Onglyza as outpt. Started on tradjenta by primary team - A1c for baseline - SSI for optimal control. May need to stop tradjenta if becomes hypoglycemic.   AKI: . Baseline 1.0. This is to be expected given combination of cardiorenal syndrome and aggressive diuresis, creatinine being monitored by cardiology as they are adjusting diuretics  Lower extremity edema:   No history of chest pain, palpitations or tachycardia. Patient appears to be somewhat sedentary lifestyle.Doubt DVT/PE - Right lower extremity venous duplex negative   Intertrigo: - Start nystatin    DVT prophylaxsis heparin  Code Status:  Full code    Family Communication: Discussed in detail with the patient, all imaging results, lab results  explained to the patient   Disposition Plan:  As per cardiology     Consultants:   [facility]  Procedures:   none   Antibiotics: Anti-infectives    Start     Dose/Rate Route Frequency Ordered Stop   10/11/16 0900  clindamycin (CLEOCIN) IVPB 300 mg     300 mg 100 mL/hr over 30 Minutes Intravenous Every 8 hours 10/11/16 0811           HPI/Subjective: Patient feels that her right lower extremity has improved, less pain ,less erythema  Objective: Vitals:   10/11/16 2231 10/12/16 0110 10/12/16 0532 10/12/16 0924  BP: 132/72 130/68 128/64 (!) 105/53  Pulse: 72 70 64 66  Resp: 17 17 17 17   Temp: 98 F (36.7 C) 98.1 F (36.7 C) 98.5 F (36.9 C)   TempSrc: Oral Oral Oral   SpO2: 98% 99% 99% 98%  Weight:   96.7 kg (213 lb 3.2 oz)   Height:        Intake/Output Summary (Last 24 hours) at 10/12/16 1205 Last data filed at 10/12/16 1004  Gross per 24 hour  Intake              870 ml  Output             1800 ml  Net             -930 ml    Exam:  Examination:  General exam: Appears calm and comfortable  Respiratory system: Clear to auscultation. Respiratory effort normal. Cardiovascular system: S1 & S2 heard, RRR. No JVD, murmurs, rubs, gallops or clicks. No pedal edema. Gastrointestinal  system: Abdomen is nondistended, soft and nontender. No organomegaly or masses felt. Normal bowel sounds heard. Central nervous system: Alert and oriented. No focal neurological deficits. Extremities: Symmetric 5 x 5 power. Skin:  Right lower extremity with marked erythema,  Psychiatry: Judgement and insight appear normal. Mood & affect appropriate.     Data Reviewed: I have personally reviewed following labs and imaging studies  Micro Results No results found for this or any previous visit (from the past 240 hour(s)).  Radiology Reports Dg Chest 2 View  Result Date: 10/06/2016 CLINICAL DATA:  Bilateral lower extremity edema for a few weeks, worsening. Onset of shortness of breath  and nausea this week. EXAM: CHEST  2 VIEW COMPARISON:  PA and lateral chest 12/23/2003. FINDINGS: There is marked cardiomegaly without edema. No consolidative process, pneumothorax or effusion. Small area of linear scar in the lingula is unchanged. The patient is status post CABG. Atherosclerosis noted. No acute bony abnormality. IMPRESSION: Cardiomegaly without edema.  No acute disease. Atherosclerosis. Electronically Signed   By: Drusilla Kanner M.D.   On: 10/06/2016 18:13     CBC  Recent Labs Lab 10/06/16 1642 10/10/16 1836  WBC 8.9 8.2  HGB 13.1 12.9  HCT 41.2 40.3  PLT 237 241  MCV 93.8 93.3  MCH 29.8 29.9  MCHC 31.8 32.0  RDW 15.0 15.1    Chemistries   Recent Labs Lab 10/06/16 1642 10/10/16 1836 10/11/16 0453 10/12/16 0337  NA 143 141 142 141  K 3.8 3.6 3.7 4.0  CL 108 103 102 99*  CO2 24 28 27 30   GLUCOSE 152* 145* 103* 116*  BUN 23* 19 20 19   CREATININE 1.06* 1.58* 1.57* 1.68*  CALCIUM 8.6* 8.7* 8.5* 8.3*  AST  --  28  --   --   ALT  --  20  --   --   ALKPHOS  --  49  --   --   BILITOT  --  0.8  --   --    ------------------------------------------------------------------------------------------------------------------ estimated creatinine clearance is 26 mL/min (A) (by C-G formula based on SCr of 1.68 mg/dL (H)). ------------------------------------------------------------------------------------------------------------------ No results for input(s): HGBA1C in the last 72 hours. ------------------------------------------------------------------------------------------------------------------  Recent Labs  10/11/16 0453  CHOL 63  HDL 31*  LDLCALC 13  TRIG 95  CHOLHDL 2.0   ------------------------------------------------------------------------------------------------------------------ No results for input(s): TSH, T4TOTAL, T3FREE, THYROIDAB in the last 72 hours.  Invalid input(s):  FREET3 ------------------------------------------------------------------------------------------------------------------ No results for input(s): VITAMINB12, FOLATE, FERRITIN, TIBC, IRON, RETICCTPCT in the last 72 hours.  Coagulation profile No results for input(s): INR, PROTIME in the last 168 hours.  No results for input(s): DDIMER in the last 72 hours.  Cardiac Enzymes  Recent Labs Lab 10/06/16 1844  TROPONINI 0.11*   ------------------------------------------------------------------------------------------------------------------ Invalid input(s): POCBNP   CBG:  Recent Labs Lab 10/11/16 1654 10/11/16 2222 10/12/16 0805 10/12/16 1154  GLUCAP 155* 110* 115* 114*       Studies: No results found.    No results found for: HGBA1C Lab Results  Component Value Date   LDLCALC 13 10/11/2016   CREATININE 1.68 (H) 10/12/2016       Scheduled Meds: . aspirin EC  81 mg Oral Daily  . furosemide  60 mg Intravenous TID  . heparin  5,000 Units Subcutaneous Q8H  . insulin aspart  0-5 Units Subcutaneous QHS  . insulin aspart  0-9 Units Subcutaneous TID WC  . lactobacillus acidophilus  2 tablet Oral TID  . linagliptin  5 mg Oral  Daily  . metoprolol succinate  100 mg Oral Daily  . multivitamin with minerals  1 tablet Oral Daily  . nystatin ointment   Topical BID  . rosuvastatin  10 mg Oral Daily   Continuous Infusions: . clindamycin (CLEOCIN) IV Stopped (10/12/16 0909)     LOS: 1 day    Time spent: >30 MINS    Richarda Overlie  [facility] Pager [phone]. If 7PM-7AM, please contact night-coverage at [email or web address], password [facility] 10/12/2016, 12:05 PM  LOS: 1 day

## 2016-10-12 NOTE — Progress Notes (Addendum)
Progress Note  Patient Name: Brittney Lopez Date of Encounter: 10/12/2016  Primary Cardiologist: Dr. Armanda Magic  Subjective   Feeling well.  Denies chest pain or shortness of breath.  Endorses orthopnea.   Inpatient Medications    Scheduled Meds: . aspirin EC  81 mg Oral Daily  . furosemide  60 mg Intravenous TID  . heparin  5,000 Units Subcutaneous Q8H  . insulin aspart  0-5 Units Subcutaneous QHS  . insulin aspart  0-9 Units Subcutaneous TID WC  . lactobacillus acidophilus  2 tablet Oral TID  . linagliptin  5 mg Oral Daily  . metoprolol succinate  100 mg Oral Daily  . multivitamin with minerals  1 tablet Oral Daily  . nystatin ointment   Topical BID  . rosuvastatin  10 mg Oral Daily   Continuous Infusions: . clindamycin (CLEOCIN) IV Stopped (10/12/16 0909)   PRN Meds: acetaminophen, nitroGLYCERIN, ondansetron (ZOFRAN) IV   Vital Signs    Vitals:   10/11/16 2231 10/12/16 0110 10/12/16 0532 10/12/16 0924  BP: 132/72 130/68 128/64 (!) 105/53  Pulse: 72 70 64 66  Resp: 17 17 17 17   Temp: 98 F (36.7 C) 98.1 F (36.7 C) 98.5 F (36.9 C)   TempSrc: Oral Oral Oral   SpO2: 98% 99% 99% 98%  Weight:   96.7 kg (213 lb 3.2 oz)   Height:        Intake/Output Summary (Last 24 hours) at 10/12/16 0943 Last data filed at 10/12/16 0839  Gross per 24 hour  Intake              800 ml  Output             2250 ml  Net            -1450 ml   Filed Weights   10/10/16 1552 10/11/16 0413 10/12/16 0532  Weight: 99 kg (218 lb 4.8 oz) 98.2 kg (216 lb 6.4 oz) 96.7 kg (213 lb 3.2 oz)    Telemetry    Sinus rhythm. 9 beats of NSVT. - Personally Reviewed  ECG    N/a - Personally Reviewed  Physical Exam   GEN: No acute distress.   Neck: JVP to mid neck.  +HJR. Cardiac: RRR, no murmurs, rubs, or gallops.  Normal S1/S2.  Respiratory:  bibasilar crackles.  GI: Soft, nontender, non-distended  MS: 2+ LE edema bilaterally to mid tibia.  Erythema of RLE.  No  deformity. Neuro:  Nonfocal  Psych: Normal affect   Labs    Chemistry Recent Labs Lab 10/10/16 1836 10/11/16 0453 10/12/16 0337  NA 141 142 141  K 3.6 3.7 4.0  CL 103 102 99*  CO2 28 27 30   GLUCOSE 145* 103* 116*  BUN 19 20 19   CREATININE 1.58* 1.57* 1.68*  CALCIUM 8.7* 8.5* 8.3*  PROT 6.5  --   --   ALBUMIN 3.8  --   --   AST 28  --   --   ALT 20  --   --   ALKPHOS 49  --   --   BILITOT 0.8  --   --   GFRNONAA 29* 29* 27*  GFRAA 33* 34* 31*  ANIONGAP 10 13 12      Hematology Recent Labs Lab 10/06/16 1642 10/10/16 1836  WBC 8.9 8.2  RBC 4.39 4.32  HGB 13.1 12.9  HCT 41.2 40.3  MCV 93.8 93.3  MCH 29.8 29.9  MCHC 31.8 32.0  RDW 15.0 15.1  PLT 237 241    Cardiac Enzymes Recent Labs Lab 10/06/16 1844  TROPONINI 0.11*    Recent Labs Lab 10/06/16 1652 10/06/16 1853 10/06/16 2224  TROPIPOC 0.12* 0.11* 0.11*     BNP Recent Labs Lab 10/06/16 2217 10/10/16 1836  BNP 4,245.0* 3,937.3*     DDimer  Recent Labs Lab 10/06/16 2217  DDIMER 1.40*     Radiology    No results found.  Cardiac Studies   Echo 10/11/16:   Study Conclusions  - Left ventricle: The cavity size was moderately dilated. Wall   thickness was normal. The estimated ejection fraction was 25%.   Diffuse hypokinesis. Doppler parameters are consistent with both   elevated ventricular end-diastolic filling pressure and elevated   left atrial filling pressure. - Mitral valve: Calcified annulus. There was mild regurgitation. - Left atrium: The atrium was moderately dilated. - Atrial septum: No defect or patent foramen ovale was identified. - Tricuspid valve: There was moderate regurgitation.   Patient Profile     Ms. Smallwood is an 78F with CAD s/p CABG, hypertension, hyperlipidemia, CKD III, and morbid obesity admitted with LE cellulitis and acute systolic and diastolic heart failure.  Assessment & Plan    # Acute systolic and diastolic heart failure: Ms. Colan was  found to have newly diagnosed acute systolic and diastolic heart failure. LVEF is 25% down from 55-60% 08/2013.  She remains volume overloaded but is diuresing well. Her weight has gone from 98.2 kg to 96.7 kg.  Net -1.6L yesterday.  Creatinine is starting to rise slightly but BUN remains stable. Continue with Lasix 60 mg every 8 hours. We will plan for left heart catheterization once she is more euvolemic and if her renal function remained stable.  # CAD s/p CABG:  No active chest pain.  Continue aspirin, metoprolol, and rosuvastatin.  Signed, Chilton Si, MD  10/12/2016, 9:43 AM

## 2016-10-13 ENCOUNTER — Ambulatory Visit: Payer: Medicare Other | Admitting: Physician Assistant

## 2016-10-13 DIAGNOSIS — I5041 Acute combined systolic (congestive) and diastolic (congestive) heart failure: Secondary | ICD-10-CM

## 2016-10-13 LAB — BASIC METABOLIC PANEL
Anion gap: 13 (ref 5–15)
BUN: 22 mg/dL — AB (ref 6–20)
CHLORIDE: 97 mmol/L — AB (ref 101–111)
CO2: 29 mmol/L (ref 22–32)
Calcium: 7.9 mg/dL — ABNORMAL LOW (ref 8.9–10.3)
Creatinine, Ser: 1.64 mg/dL — ABNORMAL HIGH (ref 0.44–1.00)
GFR calc Af Amer: 32 mL/min — ABNORMAL LOW (ref >60)
GFR calc non Af Amer: 27 mL/min — ABNORMAL LOW (ref >60)
Glucose, Bld: 117 mg/dL — ABNORMAL HIGH (ref 65–99)
POTASSIUM: 3.2 mmol/L — AB (ref 3.5–5.1)
SODIUM: 139 mmol/L (ref 135–145)

## 2016-10-13 LAB — GLUCOSE, CAPILLARY
GLUCOSE-CAPILLARY: 165 mg/dL — AB (ref 65–99)
GLUCOSE-CAPILLARY: 119 mg/dL — AB (ref 65–99)
Glucose-Capillary: 114 mg/dL — ABNORMAL HIGH (ref 65–99)
Glucose-Capillary: 115 mg/dL — ABNORMAL HIGH (ref 65–99)

## 2016-10-13 LAB — HEMOGLOBIN A1C
Hgb A1c MFr Bld: 6.8 % — ABNORMAL HIGH (ref 4.8–5.6)
Mean Plasma Glucose: 148 mg/dL

## 2016-10-13 MED ORDER — CLINDAMYCIN HCL 300 MG PO CAPS
300.0000 mg | ORAL_CAPSULE | Freq: Three times a day (TID) | ORAL | Status: DC
  Administered 2016-10-13 – 2016-10-14 (×3): 300 mg via ORAL
  Filled 2016-10-13 (×3): qty 1

## 2016-10-13 MED ORDER — SACCHAROMYCES BOULARDII 250 MG PO CAPS
250.0000 mg | ORAL_CAPSULE | Freq: Two times a day (BID) | ORAL | Status: DC
  Administered 2016-10-13 – 2016-10-18 (×11): 250 mg via ORAL
  Filled 2016-10-13 (×11): qty 1

## 2016-10-13 MED ORDER — POTASSIUM CHLORIDE CRYS ER 20 MEQ PO TBCR
40.0000 meq | EXTENDED_RELEASE_TABLET | Freq: Once | ORAL | Status: AC
  Administered 2016-10-13: 40 meq via ORAL
  Filled 2016-10-13: qty 2

## 2016-10-13 MED ORDER — FUROSEMIDE 10 MG/ML IJ SOLN
80.0000 mg | Freq: Two times a day (BID) | INTRAMUSCULAR | Status: DC
  Administered 2016-10-13 – 2016-10-18 (×9): 80 mg via INTRAVENOUS
  Filled 2016-10-13 (×9): qty 8

## 2016-10-13 NOTE — Progress Notes (Signed)
Daughter called back, update given. Informed daughter that her number was provided to the cardiologist. Called daughter from pt room phone so she was able to talk to pt  Brittney Lopez Elige Radon

## 2016-10-13 NOTE — Care Management Note (Signed)
Case Management Note  Patient Details  Name: Brittney Lopez MRN: [id number] Date of Birth: [birth year]  Subjective/Objective:    Admitted with CHF               Action/Plan: Patient lives at home with her grandson; FTD:DUKGU, Candace, MD; has private insurance with [facility] with prescription drug coverage; pharmacy of choice is [facility]; patient states that her neighbors or daughter assist in her care and takes her to her Md apts; she states that she does not have any DME at home, no longer drives a car; patient could benefit from a Disease Management for CHF but is refusing all HHC at this time; CM will continue to follow for DCP  Expected Discharge Date:     Possibly  10/18/2016            Expected Discharge Plan:  Home w Home Health Services  Discharge planning Services  CM Consult  Choice offered to:  Patient  HH Arranged:  Patient Refused HH  Status of Service:  In process, will continue to follow  Reola Mosher [phone] 10/13/2016, 11:42 AM

## 2016-10-13 NOTE — Progress Notes (Signed)
Spoke with Ms. Butler's daughter who expresses concerns about her mother's ability to care for herself at home.  She was essentially living at home alone.  Daughter notes that her mother is not taking medication as prescribed and often forgets it.  She is also unable to bathe herself. Daughter is seeking assistance with ADLs.  Spoke with her PCP, Dr. Katrinka Blazing, who suggested palliative care.  We discussed the fact that this team is unlikely to be able to provide ADL assistance and works more with symptom management.  We will get PT/OT evaluations for needs assessment.  Will also ask palliative care to see her and see if they may be able to guide them to resources.   Eugenia Eldredge C. Duke Salvia, MD, [facility]  10/13/2016 6:23 PM

## 2016-10-13 NOTE — Progress Notes (Signed)
[facility] PROGRESS NOTE  Brittney Lopez:[id number] DOB: 1929-03-12 DOA: 10/10/2016   PCP: Merri Brunette, MD     Assessment/Plan: Active Problems:   Cellulitis   Acute on chronic diastolic CHF (congestive heart failure) (HCC)   Swelling of right lower extremity   Diabetes with skin complication (HCC)   Intertrigo   AKI (acute kidney injury) (HCC)   83 y.o. female who presents w/ a past medical history significant for CHF, CKD, CAD/CABG, DM, HLD, HTN, obesity, and Pulmonary HTN. Pt admitted on 10/10/16 for decompensated CHF by the cardiology team. They have consulted [facility] for assistance w/ RLE cellulitis. There was concern that she had failed outpt therapy w/ Keflex and Bactrim.  Assessment and plan Acute on chronic diastolic congestive heart failure: Patient's primary admitting diagnoses. Patient appears to be responding well to diuresis. - Management per primary team, cardiology BUN/creatinine and creatinine are starting to rise  Right lower extremity cellulitis:   white blood cell count within normal limits. Hopefully cellulitis will will improve with improved diuresis. Continue- IV clindamycin change to oral. Concomitantly started on a probiotic  DM: Currently on tradjenta , SSI Hemoglobin A1c 6.8  AKI: . Baseline 1.0. This is to be expected given combination of cardiorenal syndrome and aggressive diuresis, creatinine being monitored by cardiology as they are adjusting diuretics  Lower extremity edema:   No history of chest pain, palpitations or tachycardia. Patient appears to be somewhat sedentary lifestyle.Doubt DVT/PE - Right lower extremity venous duplex negative   Intertrigo: - Start nystatin  Hypokalemia Replete    DVT prophylaxsis heparin  Code Status:  Full code    Family Communication: Discussed in detail with the patient, all imaging results, lab results explained to the patient   Disposition Plan:  As per cardiology      Consultants:   [facility]  Procedures:   none   Antibiotics: Anti-infectives    Start     Dose/Rate Route Frequency Ordered Stop   10/11/16 0900  clindamycin (CLEOCIN) IVPB 300 mg     300 mg 100 mL/hr over 30 Minutes Intravenous Every 8 hours 10/11/16 0811              Objective: Vitals:   10/12/16 1245 10/12/16 1540 10/13/16 0540 10/13/16 0945  BP: 123/60 112/61 110/60 (!) 104/54  Pulse: 68 65 69 65  Resp: 18 16 17 16   Temp: 97.8 F (36.6 C)  97.9 F (36.6 C)   TempSrc: Oral  Oral   SpO2: 97%  98%   Weight:   95.4 kg (210 lb 6.4 oz)   Height:        Intake/Output Summary (Last 24 hours) at 10/13/16 1347 Last data filed at 10/13/16 1113  Gross per 24 hour  Intake              650 ml  Output             1700 ml  Net            -1050 ml    Exam:  Examination:  General exam: Appears calm and comfortable  Respiratory system: Clear to auscultation. Respiratory effort normal. Cardiovascular system: S1 & S2 heard, RRR. No JVD, murmurs, rubs, gallops or clicks. No pedal edema. Gastrointestinal system: Abdomen is nondistended, soft and nontender. No organomegaly or masses felt. Normal bowel sounds heard. Central nervous system: Alert and oriented. No focal neurological deficits. Extremities: Symmetric 5 x 5 power. Skin:  Right lower  extremity with marked erythema,  Psychiatry: Judgement and insight appear normal. Mood & affect appropriate.     Data Reviewed: I have personally reviewed following labs and imaging studies  Micro Results No results found for this or any previous visit (from the past 240 hour(s)).  Radiology Reports Dg Chest 2 View  Result Date: 10/06/2016 CLINICAL DATA:  Bilateral lower extremity edema for a few weeks, worsening. Onset of shortness of breath and nausea this week. EXAM: CHEST  2 VIEW COMPARISON:  PA and lateral chest 12/23/2003. FINDINGS: There is marked cardiomegaly without edema. No consolidative process, pneumothorax or effusion.  Small area of linear scar in the lingula is unchanged. The patient is status post CABG. Atherosclerosis noted. No acute bony abnormality. IMPRESSION: Cardiomegaly without edema.  No acute disease. Atherosclerosis. Electronically Signed   By: Drusilla Kanner M.D.   On: 10/06/2016 18:13     CBC  Recent Labs Lab 10/06/16 1642 10/10/16 1836  WBC 8.9 8.2  HGB 13.1 12.9  HCT 41.2 40.3  PLT 237 241  MCV 93.8 93.3  MCH 29.8 29.9  MCHC 31.8 32.0  RDW 15.0 15.1    Chemistries   Recent Labs Lab 10/06/16 1642 10/10/16 1836 10/11/16 0453 10/12/16 0337 10/13/16 0336  NA 143 141 142 141 139  K 3.8 3.6 3.7 4.0 3.2*  CL 108 103 102 99* 97*  CO2 24 28 27 30 29   GLUCOSE 152* 145* 103* 116* 117*  BUN 23* 19 20 19  22*  CREATININE 1.06* 1.58* 1.57* 1.68* 1.64*  CALCIUM 8.6* 8.7* 8.5* 8.3* 7.9*  AST  --  28  --   --   --   ALT  --  20  --   --   --   ALKPHOS  --  49  --   --   --   BILITOT  --  0.8  --   --   --    ------------------------------------------------------------------------------------------------------------------ estimated creatinine clearance is 26.4 mL/min (A) (by C-G formula based on SCr of 1.64 mg/dL (H)). ------------------------------------------------------------------------------------------------------------------  Recent Labs  10/12/16 0337  HGBA1C 6.8*   ------------------------------------------------------------------------------------------------------------------  Recent Labs  10/11/16 0453  CHOL 63  HDL 31*  LDLCALC 13  TRIG 95  CHOLHDL 2.0   ------------------------------------------------------------------------------------------------------------------ No results for input(s): TSH, T4TOTAL, T3FREE, THYROIDAB in the last 72 hours.  Invalid input(s): FREET3 ------------------------------------------------------------------------------------------------------------------ No results for input(s): VITAMINB12, FOLATE, FERRITIN, TIBC, IRON,  RETICCTPCT in the last 72 hours.  Coagulation profile No results for input(s): INR, PROTIME in the last 168 hours.  No results for input(s): DDIMER in the last 72 hours.  Cardiac Enzymes  Recent Labs Lab 10/06/16 1844  TROPONINI 0.11*   ------------------------------------------------------------------------------------------------------------------ Invalid input(s): POCBNP   CBG:  Recent Labs Lab 10/12/16 1154 10/12/16 1628 10/12/16 2344 10/13/16 0735 10/13/16 1134  GLUCAP 114* 132* 147* 114* 115*       Studies: No results found.    Lab Results  Component Value Date   HGBA1C 6.8 (H) 10/12/2016   Lab Results  Component Value Date   LDLCALC 13 10/11/2016   CREATININE 1.64 (H) 10/13/2016       Scheduled Meds: . aspirin EC  81 mg Oral Daily  . furosemide  80 mg Intravenous Q12H  . heparin  5,000 Units Subcutaneous Q8H  . insulin aspart  0-5 Units Subcutaneous QHS  . insulin aspart  0-9 Units Subcutaneous TID WC  . lactobacillus acidophilus  2 tablet Oral TID  . linagliptin  5 mg Oral Daily  .  metoprolol succinate  100 mg Oral Daily  . multivitamin with minerals  1 tablet Oral Daily  . nystatin ointment   Topical BID  . rosuvastatin  10 mg Oral Daily   Continuous Infusions: . clindamycin (CLEOCIN) IV Stopped (10/13/16 1017)     LOS: 2 days    Time spent: >30 MINS    Richarda Overlie  [facility] Pager [phone]. If 7PM-7AM, please contact night-coverage at [email or web address], password [facility] 10/13/2016, 1:47 PM  LOS: 2 days

## 2016-10-13 NOTE — Progress Notes (Signed)
Daughter called for update from MD in regards to cardiac catheterization. Text page sent to cardiology team B and provided daughter number [phone].

## 2016-10-13 NOTE — Progress Notes (Addendum)
Progress Note  Patient Name: Brittney Lopez Date of Encounter: 10/13/2016  Primary Cardiologist: Dr. Armanda Magic  Subjective   Feeling well.  Denies chest pain or shortness of breath.    Inpatient Medications    Scheduled Meds: . aspirin EC  81 mg Oral Daily  . furosemide  60 mg Intravenous TID  . heparin  5,000 Units Subcutaneous Q8H  . insulin aspart  0-5 Units Subcutaneous QHS  . insulin aspart  0-9 Units Subcutaneous TID WC  . lactobacillus acidophilus  2 tablet Oral TID  . linagliptin  5 mg Oral Daily  . metoprolol succinate  100 mg Oral Daily  . multivitamin with minerals  1 tablet Oral Daily  . nystatin ointment   Topical BID  . rosuvastatin  10 mg Oral Daily   Continuous Infusions: . clindamycin (CLEOCIN) IV Stopped (10/13/16 1017)   PRN Meds: acetaminophen, nitroGLYCERIN, ondansetron (ZOFRAN) IV   Vital Signs    Vitals:   10/12/16 1245 10/12/16 1540 10/13/16 0540 10/13/16 0945  BP: 123/60 112/61 110/60 (!) 104/54  Pulse: 68 65 69 65  Resp: 18 16 17 16   Temp: 97.8 F (36.6 C)  97.9 F (36.6 C)   TempSrc: Oral  Oral   SpO2: 97%  98%   Weight:   95.4 kg (210 lb 6.4 oz)   Height:        Intake/Output Summary (Last 24 hours) at 10/13/16 1059 Last data filed at 10/13/16 0849  Gross per 24 hour  Intake              650 ml  Output             1900 ml  Net            -1250 ml   Filed Weights   10/11/16 0413 10/12/16 0532 10/13/16 0540  Weight: 98.2 kg (216 lb 6.4 oz) 96.7 kg (213 lb 3.2 oz) 95.4 kg (210 lb 6.4 oz)    Telemetry    Sinus rhythm. No events - Personally Reviewed  ECG    N/a - Personally Reviewed  Physical Exam   GEN: No acute distress.   Neck: JVP to mid neck.  +HJR. Cardiac: RRR, no murmurs, rubs, or gallops.  Normal S1/S2.  Respiratory:  Bibasilar crackles,  GI: Soft, nontender, non-distended  MS: 2+ LE edema bilaterally to lower tibia.  Erythema of RLE.  No deformity. Neuro:  Nonfocal  Psych: Normal affect   Labs      Chemistry  Recent Labs Lab 10/10/16 1836 10/11/16 0453 10/12/16 0337 10/13/16 0336  NA 141 142 141 139  K 3.6 3.7 4.0 3.2*  CL 103 102 99* 97*  CO2 28 27 30 29   GLUCOSE 145* 103* 116* 117*  BUN 19 20 19  22*  CREATININE 1.58* 1.57* 1.68* 1.64*  CALCIUM 8.7* 8.5* 8.3* 7.9*  PROT 6.5  --   --   --   ALBUMIN 3.8  --   --   --   AST 28  --   --   --   ALT 20  --   --   --   ALKPHOS 49  --   --   --   BILITOT 0.8  --   --   --   GFRNONAA 29* 29* 27* 27*  GFRAA 33* 34* 31* 32*  ANIONGAP 10 13 12 13      Hematology  Recent Labs Lab 10/06/16 1642 10/10/16 1836  WBC 8.9 8.2  RBC  4.39 4.32  HGB 13.1 12.9  HCT 41.2 40.3  MCV 93.8 93.3  MCH 29.8 29.9  MCHC 31.8 32.0  RDW 15.0 15.1  PLT 237 241    Cardiac Enzymes  Recent Labs Lab 10/06/16 1844  TROPONINI 0.11*     Recent Labs Lab 10/06/16 1652 10/06/16 1853 10/06/16 2224  TROPIPOC 0.12* 0.11* 0.11*     BNP  Recent Labs Lab 10/06/16 2217 10/10/16 1836  BNP 4,245.0* 3,937.3*     DDimer   Recent Labs Lab 10/06/16 2217  DDIMER 1.40*     Radiology    No results found.  Cardiac Studies   Echo 10/11/16:   Study Conclusions  - Left ventricle: The cavity size was moderately dilated. Wall   thickness was normal. The estimated ejection fraction was 25%.   Diffuse hypokinesis. Doppler parameters are consistent with both   elevated ventricular end-diastolic filling pressure and elevated   left atrial filling pressure. - Mitral valve: Calcified annulus. There was mild regurgitation. - Left atrium: The atrium was moderately dilated. - Atrial septum: No defect or patent foramen ovale was identified. - Tricuspid valve: There was moderate regurgitation.   Patient Profile     Brittney Lopez is an 27F with CAD s/p remote CABG, hypertension, hyperlipidemia, CKD III, and morbid obesity admitted with LE cellulitis and acute systolic and diastolic heart failure.  Assessment & Plan    # Acute systolic  and diastolic heart failure: Brittney Lopez was found to have newly diagnosed acute systolic and diastolic heart failure. LVEF is 25% down from 55-60% 08/2013.  She remains volume overloaded but is diuresing well. Her weight has gone from 98.2 kg to 95.4 kg.  Net -1.3L yesterday.  BUN/creatinine and creatinine are starting to rise. She is still currently volume overloaded. We will change her Lasix to 80 mg every 12 hours. Consider switch to oral Lasix tomorrow. She will need cardiac catheterization once she is more euvolemic and if her renal function remained stable.  Likely Monday.  # CAD s/p CABG:  No active chest pain.  Continue aspirin, metoprolol, and rosuvastatin.  Signed, Chilton Si, MD  10/13/2016, 10:59 AM

## 2016-10-14 DIAGNOSIS — I2510 Atherosclerotic heart disease of native coronary artery without angina pectoris: Secondary | ICD-10-CM

## 2016-10-14 DIAGNOSIS — L03119 Cellulitis of unspecified part of limb: Secondary | ICD-10-CM

## 2016-10-14 LAB — BASIC METABOLIC PANEL
Anion gap: 12 (ref 5–15)
BUN: 21 mg/dL — ABNORMAL HIGH (ref 6–20)
CALCIUM: 8.2 mg/dL — AB (ref 8.9–10.3)
CO2: 29 mmol/L (ref 22–32)
Chloride: 98 mmol/L — ABNORMAL LOW (ref 101–111)
Creatinine, Ser: 1.34 mg/dL — ABNORMAL HIGH (ref 0.44–1.00)
GFR calc Af Amer: 41 mL/min — ABNORMAL LOW (ref >60)
GFR calc non Af Amer: 35 mL/min — ABNORMAL LOW (ref >60)
GLUCOSE: 115 mg/dL — AB (ref 65–99)
Potassium: 3.8 mmol/L (ref 3.5–5.1)
Sodium: 139 mmol/L (ref 135–145)

## 2016-10-14 LAB — GLUCOSE, CAPILLARY
GLUCOSE-CAPILLARY: 151 mg/dL — AB (ref 65–99)
Glucose-Capillary: 116 mg/dL — ABNORMAL HIGH (ref 65–99)
Glucose-Capillary: 135 mg/dL — ABNORMAL HIGH (ref 65–99)
Glucose-Capillary: 162 mg/dL — ABNORMAL HIGH (ref 65–99)

## 2016-10-14 MED ORDER — CLINDAMYCIN HCL 300 MG PO CAPS
300.0000 mg | ORAL_CAPSULE | Freq: Three times a day (TID) | ORAL | Status: AC
  Administered 2016-10-14 – 2016-10-16 (×6): 300 mg via ORAL
  Filled 2016-10-14 (×6): qty 1

## 2016-10-14 NOTE — Progress Notes (Signed)
[facility] PROGRESS NOTE  Brittney Lopez GNO:[id number] DOB: 1929-03-25 DOA: 10/10/2016   PCP: Merri Brunette, MD     Assessment/Plan: Active Problems:   Cellulitis   Acute on chronic diastolic CHF (congestive heart failure) (HCC)   Swelling of right lower extremity   Diabetes with skin complication (HCC)   Intertrigo   AKI (acute kidney injury) (HCC)    83 y.o. female who presents w/ a past medical history significant for CHF, CKD, CAD/CABG, DM, HLD, HTN, obesity, and Pulmonary HTN. Pt admitted on 10/10/16 for decompensated CHF by the cardiology team. They have consulted [facility] for assistance w/ RLE cellulitis. There was concern that she had failed outpt therapy w/ Keflex and Bactrim.  Assessment and plan Acute on chronic diastolic congestive heart failure: Patient's primary admitting diagnoses. Patient appears to be responding well to diuresis. Primary team to continue diuresis Plan is for cardiac catheterization once the patient is more euvolemic and if her renal function remained stable  Right lower extremity cellulitis:   white blood cell count within normal limits. Hopefully cellulitis will will improve with improved diuresis. Continue clindamycin 2 more days then discontinue. Continue   a probiotic  DM: Currently on tradjenta , SSI Hemoglobin A1c 6.8  AKI: . Baseline 1.0. This is to be expected given combination of cardiorenal syndrome and aggressive diuresis, creatinine being monitored by cardiology as they are adjusting diuretics, improving with diuresis  Lower extremity edema:   No history of chest pain, palpitations or tachycardia. Patient appears to be somewhat sedentary lifestyle.Doubt DVT/PE Right lower extremity venous duplex negative   Intertrigo: - Start nystatin  Hypokalemia Replete    DVT prophylaxsis heparin  Code Status:  Full code    Family Communication: Discussed in detail with the patient, all imaging results, lab results  explained to the patient   Disposition Plan:  As per cardiology,[facility] will sign off      Consultants:   [facility]  Procedures:   none   Antibiotics: Anti-infectives    Start     Dose/Rate Route Frequency Ordered Stop   10/13/16 1730  clindamycin (CLEOCIN) capsule 300 mg     300 mg Oral Every 8 hours 10/13/16 1348     10/11/16 0900  clindamycin (CLEOCIN) IVPB 300 mg  Status:  Discontinued     300 mg 100 mL/hr over 30 Minutes Intravenous Every 8 hours 10/11/16 0811 10/13/16 1348            Objective: Vitals:   10/13/16 0945 10/13/16 1700 10/13/16 2210 10/14/16 0614  BP: (!) 104/54 120/61 117/60 (!) 124/57  Pulse: 65 76 66 70  Resp: 16 18 15 16   Temp:  98.1 F (36.7 C) 97.5 F (36.4 C) 98.2 F (36.8 C)  TempSrc:  Oral Oral Oral  SpO2:  95% 97% 99%  Weight:    93.5 kg (206 lb 3.2 oz)  Height:        Intake/Output Summary (Last 24 hours) at 10/14/16 0808 Last data filed at 10/14/16 0517  Gross per 24 hour  Intake              940 ml  Output             1550 ml  Net             -610 ml    Exam:  Examination:  General exam: Appears calm and comfortable  Respiratory system: Clear to auscultation. Respiratory effort normal. Cardiovascular system: S1 &  S2 heard, RRR. No JVD, murmurs, rubs, gallops or clicks. No pedal edema. Gastrointestinal system: Abdomen is nondistended, soft and nontender. No organomegaly or masses felt. Normal bowel sounds heard. Central nervous system: Alert and oriented. No focal neurological deficits. Extremities: Symmetric 5 x 5 power. Skin:  Right lower extremity with marked erythema,  Psychiatry: Judgement and insight appear normal. Mood & affect appropriate.     Data Reviewed: I have personally reviewed following labs and imaging studies  Micro Results No results found for this or any previous visit (from the past 240 hour(s)).  Radiology Reports Dg Chest 2 View  Result Date: 10/06/2016 CLINICAL DATA:  Bilateral lower extremity  edema for a few weeks, worsening. Onset of shortness of breath and nausea this week. EXAM: CHEST  2 VIEW COMPARISON:  PA and lateral chest 12/23/2003. FINDINGS: There is marked cardiomegaly without edema. No consolidative process, pneumothorax or effusion. Small area of linear scar in the lingula is unchanged. The patient is status post CABG. Atherosclerosis noted. No acute bony abnormality. IMPRESSION: Cardiomegaly without edema.  No acute disease. Atherosclerosis. Electronically Signed   By: Drusilla Kanner M.D.   On: 10/06/2016 18:13     CBC  Recent Labs Lab 10/10/16 1836  WBC 8.2  HGB 12.9  HCT 40.3  PLT 241  MCV 93.3  MCH 29.9  MCHC 32.0  RDW 15.1    Chemistries   Recent Labs Lab 10/10/16 1836 10/11/16 0453 10/12/16 0337 10/13/16 0336 10/14/16 0548  NA 141 142 141 139 139  K 3.6 3.7 4.0 3.2* 3.8  CL 103 102 99* 97* 98*  CO2 28 27 30 29 29   GLUCOSE 145* 103* 116* 117* 115*  BUN 19 20 19  22* 21*  CREATININE 1.58* 1.57* 1.68* 1.64* 1.34*  CALCIUM 8.7* 8.5* 8.3* 7.9* 8.2*  AST 28  --   --   --   --   ALT 20  --   --   --   --   ALKPHOS 49  --   --   --   --   BILITOT 0.8  --   --   --   --    ------------------------------------------------------------------------------------------------------------------ estimated creatinine clearance is 32 mL/min (A) (by C-G formula based on SCr of 1.34 mg/dL (H)). ------------------------------------------------------------------------------------------------------------------  Recent Labs  10/12/16 0337  HGBA1C 6.8*   ------------------------------------------------------------------------------------------------------------------ No results for input(s): CHOL, HDL, LDLCALC, TRIG, CHOLHDL, LDLDIRECT in the last 72 hours. ------------------------------------------------------------------------------------------------------------------ No results for input(s): TSH, T4TOTAL, T3FREE, THYROIDAB in the last 72 hours.  Invalid  input(s): FREET3 ------------------------------------------------------------------------------------------------------------------ No results for input(s): VITAMINB12, FOLATE, FERRITIN, TIBC, IRON, RETICCTPCT in the last 72 hours.  Coagulation profile No results for input(s): INR, PROTIME in the last 168 hours.  No results for input(s): DDIMER in the last 72 hours.  Cardiac Enzymes No results for input(s): CKMB, TROPONINI, MYOGLOBIN in the last 168 hours.  Invalid input(s): CK ------------------------------------------------------------------------------------------------------------------ Invalid input(s): POCBNP   CBG:  Recent Labs Lab 10/13/16 0735 10/13/16 1134 10/13/16 1539 10/13/16 2207 10/14/16 0729  GLUCAP 114* 115* 165* 119* 116*       Studies: No results found.    Lab Results  Component Value Date   HGBA1C 6.8 (H) 10/12/2016   Lab Results  Component Value Date   LDLCALC 13 10/11/2016   CREATININE 1.34 (H) 10/14/2016       Scheduled Meds: . aspirin EC  81 mg Oral Daily  . clindamycin  300 mg Oral Q8H  . furosemide  80 mg Intravenous  Q12H  . heparin  5,000 Units Subcutaneous Q8H  . insulin aspart  0-5 Units Subcutaneous QHS  . insulin aspart  0-9 Units Subcutaneous TID WC  . lactobacillus acidophilus  2 tablet Oral TID  . linagliptin  5 mg Oral Daily  . metoprolol succinate  100 mg Oral Daily  . multivitamin with minerals  1 tablet Oral Daily  . nystatin ointment   Topical BID  . rosuvastatin  10 mg Oral Daily  . saccharomyces boulardii  250 mg Oral BID   Continuous Infusions:    LOS: 3 days    Time spent: >30 MINS    Richarda Overlie  [facility] Pager [phone]. If 7PM-7AM, please contact night-coverage at [email or web address], password [facility] 10/14/2016, 8:08 AM  LOS: 3 days

## 2016-10-14 NOTE — Progress Notes (Signed)
Progress Note  Patient Name: Brittney Lopez Date of Encounter: 10/14/2016  Primary Cardiologist: Mayford Knife    Subjective   Breathing OK  No CP    Inpatient Medications    Scheduled Meds: . aspirin EC  81 mg Oral Daily  . clindamycin  300 mg Oral Q8H  . furosemide  80 mg Intravenous Q12H  . heparin  5,000 Units Subcutaneous Q8H  . insulin aspart  0-5 Units Subcutaneous QHS  . insulin aspart  0-9 Units Subcutaneous TID WC  . lactobacillus acidophilus  2 tablet Oral TID  . linagliptin  5 mg Oral Daily  . metoprolol succinate  100 mg Oral Daily  . multivitamin with minerals  1 tablet Oral Daily  . nystatin ointment   Topical BID  . rosuvastatin  10 mg Oral Daily  . saccharomyces boulardii  250 mg Oral BID   Continuous Infusions:  PRN Meds: acetaminophen, nitroGLYCERIN, ondansetron (ZOFRAN) IV   Vital Signs    Vitals:   10/13/16 1700 10/13/16 2210 10/14/16 0614 10/14/16 0900  BP: 120/61 117/60 (!) 124/57 (!) 115/58  Pulse: 76 66 70 69  Resp: 18 15 16    Temp: 98.1 F (36.7 C) 97.5 F (36.4 C) 98.2 F (36.8 C)   TempSrc: Oral Oral Oral   SpO2: 95% 97% 99% 92%  Weight:   93.5 kg (206 lb 3.2 oz)   Height:        Intake/Output Summary (Last 24 hours) at 10/14/16 1054 Last data filed at 10/14/16 0900  Gross per 24 hour  Intake              940 ml  Output             1550 ml  Net             -610 ml   Filed Weights   10/12/16 0532 10/13/16 0540 10/14/16 0614  Weight: 96.7 kg (213 lb 3.2 oz) 95.4 kg (210 lb 6.4 oz) 93.5 kg (206 lb 3.2 oz)    Telemetry    SR - Personally Reviewed  ECG      Physical Exam   GEN: No acute distress.   Neck: Neck is full Cardiac: RRR, no murmurs, rubs, or gallops.  Respiratory: Clear to auscultation bilaterally. GI: Soft, nontender, non-distended  MS: 1 +  edema; No deformity. Neuro:  Nonfocal  Psych: Normal affect   Labs    Chemistry Recent Labs Lab 10/10/16 1836  10/12/16 0337 10/13/16 0336 10/14/16 0548  NA  141  < > 141 139 139  K 3.6  < > 4.0 3.2* 3.8  CL 103  < > 99* 97* 98*  CO2 28  < > 30 29 29   GLUCOSE 145*  < > 116* 117* 115*  BUN 19  < > 19 22* 21*  CREATININE 1.58*  < > 1.68* 1.64* 1.34*  CALCIUM 8.7*  < > 8.3* 7.9* 8.2*  PROT 6.5  --   --   --   --   ALBUMIN 3.8  --   --   --   --   AST 28  --   --   --   --   ALT 20  --   --   --   --   ALKPHOS 49  --   --   --   --   BILITOT 0.8  --   --   --   --   GFRNONAA 29*  < > 27*  27* 35*  GFRAA 33*  < > 31* 32* 41*  ANIONGAP 10  < > 12 13 12   < > = values in this interval not displayed.   Hematology Recent Labs Lab 10/10/16 1836  WBC 8.2  RBC 4.32  HGB 12.9  HCT 40.3  MCV 93.3  MCH 29.9  MCHC 32.0  RDW 15.1  PLT 241    Cardiac EnzymesNo results for input(s): TROPONINI in the last 168 hours. No results for input(s): TROPIPOC in the last 168 hours.   BNP Recent Labs Lab 10/10/16 1836  BNP 3,937.3*     DDimer No results for input(s): DDIMER in the last 168 hours.   Radiology    No results found.  Cardiac Studies     Patient Profile     83 y.o. female Hx of CAD (s/p CABG), HTN, HL CKD stage 3 admited with CHF exacerbation    Assessment & Plan    1  Acute on chronic systolic / diastolic CHF  LVEF 25%    Diuresing  VOlume is still up on exam  Would continue  Diuresis with IV mes   2  CAD  S/p CABG  REmote  LVEF now down    Reviewed above with pt  She does not remember having conversation yesterday with [facility]  Does not remember possible cath  I spoke with daughter on phone  She agrees that pt has memory problems  I discussed risk/benefits of cath  May not be able to improve pumping function  There is risk  She said her mother has had problems recovering from anesthesia in past , from procedures With no chest pain and the fact that she appears comfortable I would recomm a trial of maximizing medical Rx  Follow  Agree with palliative care input  I think pt will need close f/u at home  Will reassess in AM     Signed, Dietrich Pates, MD  10/14/2016, 10:54 AM

## 2016-10-14 NOTE — Evaluation (Signed)
Physical Therapy Evaluation Patient Details Name: Brittney Lopez MRN: [id number] DOB: 04/03/1929 Today's Date: 10/14/2016   History of Present Illness  83 y.o. female who presents w/ a past medical history significant for CHF, CKD, CAD/CABG, DM, HLD, HTN, obesity, and Pulmonary HTN. Pt admitted on 10/10/16 for decompensated CHF and cellulitis in RLE.  Clinical Impression  Patient presents with problems listed below.  Will benefit from acute PT to maximize functional independence prior to discharge.  Patient with decreased strength and balance impacting gait and safety.  Patient declining SNF - recommend f/u HHPT for continued therapy.  Encouraged patient to use RW/cane during session.  She declines to use either one, even though it would improve her balance and safety.  Will continue to follow for PT.    Follow Up Recommendations Home health PT;Supervision for mobility/OOB    Equipment Recommendations  Rolling walker with 5" wheels (Patient declining use)    Recommendations for Other Services       Precautions / Restrictions Precautions Precautions: Fall Restrictions Weight Bearing Restrictions: No      Mobility  Bed Mobility               General bed mobility comments: Patient in chair.  Transfers Overall transfer level: Modified independent Equipment used: None             General transfer comment: Increased time  Ambulation/Gait Ambulation/Gait assistance: Min guard;Min assist Ambulation Distance (Feet): 120 Feet Assistive device: 1 person hand held assist Gait Pattern/deviations: Step-through pattern;Decreased step length - right;Decreased step length - left;Decreased stride length;Shuffle;Ataxic;Staggering left;Staggering right Gait velocity: decreased Gait velocity interpretation: Below normal speed for age/gender General Gait Details: Patient with slow, unsteady gait.  Does not use assistive device at baseline.  Today, patient holding on to PT's arm, and  reaching for furniture and hallway rails for balance.  LE's weak and shaky during gait.    Stairs            Wheelchair Mobility    Modified Rankin (Stroke Patients Only)       Balance Overall balance assessment: Needs assistance   Sitting balance-Leahy Scale: Good     Standing balance support: Single extremity supported Standing balance-Leahy Scale: Poor Standing balance comment: Patient requiring UE support and external assist to maintain balance during dynamic activities/gait.                             Pertinent Vitals/Pain Pain Assessment: No/denies pain    Home Living Family/patient expects to be discharged to:: Private residence Living Arrangements: Other relatives (Grandson) Available Help at Discharge: Family;Friend(s);Available PRN/intermittently (grandson, daughter, and friends) Type of Home: House ([place] home) Home Access: Level entry     Home Layout: One level Home Equipment: Cane - single point;Wheelchair - manual      Prior Function Level of Independence: Independent         Comments: Needs assist for transportation from grandson, daughter, or friends.     Hand Dominance        Extremity/Trunk Assessment   Upper Extremity Assessment Upper Extremity Assessment: Overall WFL for tasks assessed    Lower Extremity Assessment Lower Extremity Assessment: Generalized weakness       Communication   Communication: No difficulties  Cognition Arousal/Alertness: Awake/alert Behavior During Therapy: WFL for tasks assessed/performed Overall Cognitive Status: Within Functional Limits for tasks assessed (for basic tasks)  General Comments      Exercises General Exercises - Lower Extremity Long Arc Quad: AROM;Both;5 reps;Strengthening;Seated Mini-Sqauts: AROM;Both;5 reps;Strengthening;Standing (With hands on surface for balance)   Assessment/Plan    PT Assessment Patient  needs continued PT services  PT Problem List Decreased strength;Decreased activity tolerance;Decreased balance;Decreased mobility;Decreased knowledge of use of DME;Cardiopulmonary status limiting activity       PT Treatment Interventions DME instruction;Gait training;Functional mobility training;Therapeutic activities;Therapeutic exercise;Balance training;Patient/family education    PT Goals (Current goals can be found in the Care Plan section)  Acute Rehab PT Goals Patient Stated Goal: To go home PT Goal Formulation: With patient Time For Goal Achievement: 10/21/16 Potential to Achieve Goals: Good    Frequency Min 3X/week   Barriers to discharge Decreased caregiver support Lucila Maine works and patient is alone.  States daughter and friends can assist prn.    Co-evaluation               AM-PAC PT "6 Clicks" Daily Activity  Outcome Measure Difficulty turning over in bed (including adjusting bedclothes, sheets and blankets)?: None Difficulty moving from lying on back to sitting on the side of the bed? : None Difficulty sitting down on and standing up from a chair with arms (e.g., wheelchair, bedside commode, etc,.)?: None Help needed moving to and from a bed to chair (including a wheelchair)?: A Little Help needed walking in hospital room?: A Little Help needed climbing 3-5 steps with a railing? : A Little 6 Click Score: 21    End of Session Equipment Utilized During Treatment: Gait belt Activity Tolerance: Patient limited by fatigue Patient left: in chair;with call bell/phone within reach   PT Visit Diagnosis: Unsteadiness on feet (R26.81);Other abnormalities of gait and mobility (R26.89);Muscle weakness (generalized) (M62.81);Ataxic gait (R26.0)    Time: 7096-2836 PT Time Calculation (min) (ACUTE ONLY): 20 min   Charges:   PT Evaluation $PT Eval Moderate Complexity: 1 Procedure     PT G Codes:        Durenda Hurt. Renaldo Fiddler, [facility] Acute Rehab Services Pager  [phone]   Vena Austria 10/14/2016, 2:54 PM

## 2016-10-14 NOTE — Plan of Care (Signed)
Problem: Health Behavior/Discharge Planning: Goal: Ability to manage health-related needs will improve Outcome: Not Progressing Patient with memory impairment, lives at home alone, concerns regarding patient managing her own health at home   Problem: Physical Regulation: Goal: Ability to maintain clinical measurements within normal limits will improve Outcome: Progressing VSS  Problem: Tissue Perfusion: Goal: Risk factors for ineffective tissue perfusion will decrease Outcome: Progressing Maintains oxygen saturation in the 90's on room air   Problem: Fluid Volume: Goal: Ability to maintain a balanced intake and output will improve Outcome: Progressing Continues to diurese

## 2016-10-14 NOTE — Consult Note (Signed)
Consultation Note Date: 10/14/2016   Patient Name: Brittney Lopez  DOB: 03/23/1929  MRN: [id number]  Age / Sex: 83 y.o., female  PCP: Carol Ada, MD Referring Physician: Constance Haw, MD  Reason for Consultation: Disposition, Establishing goals of care and Psychosocial/spiritual support  HPI/Patient Profile: 83 y.o. female  with past medical history of ASCAD s/p CABG, HLD, HTN, dCHF, CKD stage 3, obesity, and memory loss. She presented to the ED with increasingly BLE edema despite increased diuretic. She also had right leg cellulitis that showed no improvement on outpatient abx therapy. She was admitted on 10/10/2016. Work-up revealed acute systolic and diastolic heart failure, with LVEF now 25% (55-60% in 2015). She is also being treated for her right leg cellulitis. Cardiac cath considered, however daughter wanted to avoid it for now and attempt maximizing medical management. Her daughter is concerned, however, that she is requiring more support at home than family can provide. Palliative consulted to assist in discussing available resources and helping to guide disposition.   Clinical Assessment and Goals of Care: I met with Brittney Lopez, and her daughter Mickel Baas, at her bedside. Arlys demonstrated significant memory deficits and was unable to detail her health history, current health issues, or reason for being in the hospital. Her daughter relates she has early dementia. She has been living at home, where a grandson lives as well. He is there for the evening/early morning, then paid help comes for medication administration and meal prep during the day. Family fills in throughout the week to support in bathing and housework. Mickel Baas has an excellent grasp on her mother's health issues. She explains that the goal of care should be comfort focused, which aligns with her decision to avoid an invasive cardiac evaluation. Mickel Baas wanted to meet with me to discuss how  to best support her mother and ensure comfort.  In talking through Zyla's health history and current health issues, I do believe she may qualify for Hospice. She has mixed systolic and diastolic heart failure with EF 25%. This is her second presentation to the ED in the past month, with the prior one for lower extremity edema and cellulitis. Goal of pt and family is comfort, and they do not want any invasive work-up of her cardiac issues. Given her increased symptom burden from this chronic disease, Hospice will likely best serve her in ensuring comfort.   Finally, I did discuss code status with Brittney Lopez and Mickel Baas. Jaymes verbalized a clear desire to be allowed a natural death. She does not want any aggressive interventions to prolong her life.   Primary Decision Maker Pt has significant memory deficits with both recent and remote memory. While she is able to express her wishes, I am concerned she may not have capacity to make decisions as she is not able to fully grasp options, nor express a clear rationale for her choices. Daughter, Mickel Baas, is very involved in her care and I would consider the primary decision maker. The best option is to seek consensus from both pt and daughter.  SUMMARY OF RECOMMENDATIONS    DNR, SW consulted for Hospice referal  I discussed hospice providers with pt and daughter, they elected for [facility]:  DNR. Yellow form placed in paper chart.  Symptom Management:   Pt denies symptoms at present.   Psycho-social/Spiritual:   Desire for further Chaplaincy support:no  Additional Recommendations: Education on Hospice  Prognosis:  <66moin the setting of end stage heart  failure with EF 25%. 2 ED presentation within 30 days for CHF related symptoms. High risk for acute cardiopulmonary event.   Discharge Planning: Home with Hospice      Primary Diagnoses: Present on Admission: . Acute on chronic diastolic  CHF (congestive heart failure) ([place]) . Cellulitis   I have reviewed the medical record, interviewed the patient and family, and examined the patient. The following aspects are pertinent.  Past Medical History:  Diagnosis Date  . Chronic diastolic CHF (congestive heart failure) (HCC)    grade II diastolic dysfunction on echo 08/2012   . CKD (chronic kidney disease), stage III   . Coronary artery disease    s/p CABG  . DM (diabetes mellitus) type I controlled with eye manifestation ([place])    diabetic proliferative retinopathy  . Dyslipidemia   . Hypertension   . Ischemic cardiomyopathy    Resolved with EF 60% by Echo 2007  . Memory loss    MIld,MMSE 12/19/11 difficulty clock drawing,normal animal fluency  . Obesity   . Pulmonary HTN ([place])    mild with PASP 40-43mHg echo 08/2012 and resolved by echo 2015   Social History   Social History  . Marital status: Divorced    Spouse name: N/A  . Number of children: 4  . Years of education: 12   Occupational History  . retired    Social History Main Topics  . Smoking status: Never Smoker  . Smokeless tobacco: Never Used  . Alcohol use No  . Drug use: No  . Sexual activity: Not Asked   Other Topics Concern  . None   Social History Narrative  . None   Family History  Problem Relation Age of Onset  . Other Unknown        PT STATES FM HAS NO HEALTH ISSUES   Scheduled Meds: . aspirin EC  81 mg Oral Daily  . clindamycin  300 mg Oral Q8H  . furosemide  80 mg Intravenous Q12H  . heparin  5,000 Units Subcutaneous Q8H  . insulin aspart  0-5 Units Subcutaneous QHS  . insulin aspart  0-9 Units Subcutaneous TID WC  . lactobacillus acidophilus  2 tablet Oral TID  . linagliptin  5 mg Oral Daily  . metoprolol succinate  100 mg Oral Daily  . multivitamin with minerals  1 tablet Oral Daily  . nystatin ointment   Topical BID  . rosuvastatin  10 mg Oral Daily  . saccharomyces boulardii  250 mg Oral BID   Continuous Infusions: PRN  Meds:.acetaminophen, nitroGLYCERIN, ondansetron (ZOFRAN) IV No Known Allergies   Review of Systems  Constitutional: Positive for activity change and fatigue. Negative for appetite change.  HENT: Positive for hearing loss. Negative for congestion, facial swelling, sinus pressure, sore throat and trouble swallowing.   Eyes: Negative for visual disturbance.  Respiratory: Positive for shortness of breath. Negative for cough, chest tightness and wheezing.   Cardiovascular: Positive for leg swelling. Negative for chest pain.  Gastrointestinal: Negative for abdominal distention, abdominal pain, nausea and vomiting.  Genitourinary: Negative for difficulty urinating.  Musculoskeletal: Positive for gait problem. Negative for back pain.  Skin: Positive for pallor.  Neurological: Positive for weakness. Negative for dizziness and speech difficulty.  Psychiatric/Behavioral: Positive for confusion. Negative for sleep disturbance. The patient is not nervous/anxious.    Physical Exam  Constitutional: She appears well-developed and well-nourished. No distress.  HENT:  Head: Normocephalic and atraumatic.  Mouth/Throat: Oropharynx is clear and moist. No oropharyngeal exudate.  Eyes: EOM  are normal.  Neck: Normal range of motion. Neck supple.  Cardiovascular: Normal rate and regular rhythm.   Pulmonary/Chest: Tachypnea (with movement, none at rest) noted. She has decreased breath sounds in the right lower field and the left lower field. She has no wheezes.  Abdominal: Soft. Bowel sounds are normal.  Obese  Musculoskeletal:  Moving all extremities, weak, unsteady gait  Neurological: She is alert.  Oriented to person and place. Confused on time and situation.   Skin: Skin is warm and dry. There is pallor.  Right leg with erythema and warmth   Psychiatric: She has a normal mood and affect. Thought content normal. Her speech is delayed. She is slowed. She expresses impulsivity and inappropriate judgment. She  exhibits abnormal recent memory and abnormal remote memory.   Vital Signs: BP (!) 115/58 (BP Location: Left Arm)   Pulse 69   Temp 98.2 F (36.8 C) (Oral)   Resp 16   Ht _0  (1.549 m)   Wt 93.5 kg (206 lb 3.2 oz) Comment: a scale  SpO2 92%   BMI 38.96 kg/m  Pain Assessment: 0-10   Pain Score: 5    SpO2: SpO2: 92 % O2 Device:SpO2: 92 % O2 Flow Rate: .   IO: Intake/output summary:  Intake/Output Summary (Last 24 hours) at 10/14/16 1159 Last data filed at 10/14/16 0900  Gross per 24 hour  Intake              940 ml  Output             1350 ml  Net             -410 ml    LBM: Last BM Date: 10/14/16 Baseline Weight: Weight: 99 kg (218 lb 4.8 oz) Most recent weight: Weight: 93.5 kg (206 lb 3.2 oz) (a scale)     Palliative Assessment/Data: PPS 60%    Time Total: 50 minutes Greater than 50%  of this time was spent counseling and coordinating care related to the above assessment and plan.  Signed by: Charlynn Court, NP Palliative Medicine Team Pager # [phone] (M-F 7a-5p) Team Phone # [phone] (Nights/Weekends)

## 2016-10-14 NOTE — Progress Notes (Signed)
Palliative Care  Chart reviewed. I called Brittney Lopez, the patient's daughter. She plans to be at the hospital tomorrow (6/10) and would like to sit down and talk then. We have a meeting scheduled for 10am. Ideally, PT/OT evaluation can be completed by that meeting to help guide our discussion.    Murrell Converse AGNP-C Palliative Care [phone]  No charge note.

## 2016-10-15 DIAGNOSIS — Z7189 Other specified counseling: Secondary | ICD-10-CM

## 2016-10-15 DIAGNOSIS — Z515 Encounter for palliative care: Secondary | ICD-10-CM

## 2016-10-15 LAB — CBC
HCT: 40.7 % (ref 36.0–46.0)
Hemoglobin: 13 g/dL (ref 12.0–15.0)
MCH: 30 pg (ref 26.0–34.0)
MCHC: 31.9 g/dL (ref 30.0–36.0)
MCV: 93.8 fL (ref 78.0–100.0)
Platelets: 254 10*3/uL (ref 150–400)
RBC: 4.34 MIL/uL (ref 3.87–5.11)
RDW: 14.7 % (ref 11.5–15.5)
WBC: 8.9 10*3/uL (ref 4.0–10.5)

## 2016-10-15 LAB — BASIC METABOLIC PANEL
Anion gap: 11 (ref 5–15)
BUN: 29 mg/dL — AB (ref 6–20)
CALCIUM: 8.4 mg/dL — AB (ref 8.9–10.3)
CO2: 32 mmol/L (ref 22–32)
CREATININE: 1.54 mg/dL — AB (ref 0.44–1.00)
Chloride: 95 mmol/L — ABNORMAL LOW (ref 101–111)
GFR calc Af Amer: 34 mL/min — ABNORMAL LOW (ref >60)
GFR, EST NON AFRICAN AMERICAN: 30 mL/min — AB (ref >60)
GLUCOSE: 142 mg/dL — AB (ref 65–99)
Potassium: 3.5 mmol/L (ref 3.5–5.1)
Sodium: 138 mmol/L (ref 135–145)

## 2016-10-15 LAB — GLUCOSE, CAPILLARY
GLUCOSE-CAPILLARY: 112 mg/dL — AB (ref 65–99)
GLUCOSE-CAPILLARY: 140 mg/dL — AB (ref 65–99)
Glucose-Capillary: 151 mg/dL — ABNORMAL HIGH (ref 65–99)
Glucose-Capillary: 129 mg/dL — ABNORMAL HIGH (ref 65–99)

## 2016-10-15 NOTE — Progress Notes (Signed)
Progress Note  Patient Name: Brittney Lopez Date of Encounter: 10/15/2016  Primary Cardiologist: Mayford Knife    Subjective   Pt denies CP  Breathing is OK  Does not remember talking about cath yesterday    Inpatient Medications    Scheduled Meds: . aspirin EC  81 mg Oral Daily  . clindamycin  300 mg Oral Q8H  . furosemide  80 mg Intravenous Q12H  . heparin  5,000 Units Subcutaneous Q8H  . insulin aspart  0-5 Units Subcutaneous QHS  . insulin aspart  0-9 Units Subcutaneous TID WC  . lactobacillus acidophilus  2 tablet Oral TID  . linagliptin  5 mg Oral Daily  . metoprolol succinate  100 mg Oral Daily  . multivitamin with minerals  1 tablet Oral Daily  . nystatin ointment   Topical BID  . rosuvastatin  10 mg Oral Daily  . saccharomyces boulardii  250 mg Oral BID   Continuous Infusions:  PRN Meds: acetaminophen, nitroGLYCERIN, ondansetron (ZOFRAN) IV   Vital Signs    Vitals:   10/14/16 0900 10/14/16 1330 10/14/16 1945 10/15/16 0425  BP: (!) 115/58 (!) 103/53 (!) 112/55 123/64  Pulse: 69 67 70 80  Resp:  18 18 18   Temp:  98 F (36.7 C) 98.2 F (36.8 C) 97.9 F (36.6 C)  TempSrc:  Oral Oral Oral  SpO2: 92% 97% 96% 96%  Weight:    92.8 kg (204 lb 8 oz)  Height:        Intake/Output Summary (Last 24 hours) at 10/15/16 0917 Last data filed at 10/15/16 0423  Gross per 24 hour  Intake              240 ml  Output              950 ml  Net             -710 ml    Net negatvie 5.45 L    Filed Weights   10/13/16 0540 10/14/16 0614 10/15/16 0425  Weight: 95.4 kg (210 lb 6.4 oz) 93.5 kg (206 lb 3.2 oz) 92.8 kg (204 lb 8 oz)    Telemetry    SR  - Personally Reviewed  ECG      Physical Exam   GEN: No acute distress.   Neck:Full   Cardiac: RRR, no murmurs, rubs, or gallops.  Respiratory: Rel clear   GI: Soft, nontender, non-distended  MS: 1 +  edema; No deformity.  R leg is red   Neuro:  Nonfocal  Psych: Normal affect   Labs    Chemistry Recent  Labs Lab 10/10/16 1836  10/13/16 0336 10/14/16 0548 10/15/16 0229  NA 141  < > 139 139 138  K 3.6  < > 3.2* 3.8 3.5  CL 103  < > 97* 98* 95*  CO2 28  < > 29 29 32  GLUCOSE 145*  < > 117* 115* 142*  BUN 19  < > 22* 21* 29*  CREATININE 1.58*  < > 1.64* 1.34* 1.54*  CALCIUM 8.7*  < > 7.9* 8.2* 8.4*  PROT 6.5  --   --   --   --   ALBUMIN 3.8  --   --   --   --   AST 28  --   --   --   --   ALT 20  --   --   --   --   ALKPHOS 49  --   --   --   --  BILITOT 0.8  --   --   --   --   GFRNONAA 29*  < > 27* 35* 30*  GFRAA 33*  < > 32* 41* 34*  ANIONGAP 10  < > 13 12 11   < > = values in this interval not displayed.   Hematology  Recent Labs Lab 10/10/16 1836 10/15/16 0229  WBC 8.2 8.9  RBC 4.32 4.34  HGB 12.9 13.0  HCT 40.3 40.7  MCV 93.3 93.8  MCH 29.9 30.0  MCHC 32.0 31.9  RDW 15.1 14.7  PLT 241 254    Cardiac EnzymesNo results for input(s): TROPONINI in the last 168 hours. No results for input(s): TROPIPOC in the last 168 hours.   BNP  Recent Labs Lab 10/10/16 1836  BNP 3,937.3*     DDimer No results for input(s): DDIMER in the last 168 hours.   Radiology    No results found.  Cardiac Studies     Patient Profile     83 y.o. female Hx of CAD (s/p CABG), HTN, HL CKD stage 3 admited with CHF exacerbation    Assessment & Plan    1  Acute on chronic systolic / diastolic CHF  LVEF 25%   Volume improving  I would keep with lasix Check BNP  On toprol  Reluctant  To add ACE with renal function  Follow BP before adding hydralazine /ntg  Do not want her to be hypotensive     2  CAD  S/p CABG  REmote   Reviewed above with pt  She does not remember having conversation yesterday with T [facility]  Does not remember possible cath  Spoke wth daughter yesterday re further testing  Pt has dementia  Comfortable now.  With no CP and her limited nsight and age I would reocmm continuing to maximize medical care  I would not push for invasive eval.  Pt needs to have help  once leaves the hosp to assure meds taken, keeps on correct diet, fluid restriction  3  LE swelling  R leg warm  Red  More than yesterday  Has missed a few doses of heparin  Noted doppler on last admit   6/1   Signed, Dietrich Pates, MD  10/15/2016, 9:17 AM

## 2016-10-15 NOTE — Evaluation (Addendum)
Occupational Therapy Evaluation Patient Details Name: Brittney Lopez MRN: [id number] DOB: 1929/02/07 Today's Date: 10/15/2016    History of Present Illness 83 y.o. female who presents w/ a past medical history significant for CHF, CKD, CAD/CABG, DM, HLD, HTN, obesity, and Pulmonary HTN. Pt admitted on 10/10/16 for decompensated CHF and cellulitis in RLE.   Clinical Impression   PTA, pt lived with her grandson and was independent with ADLs and IADLs. Currently, pt requires Min A for LB ADLs and Min guard for transfers. Provided pt education in preparation for dc today; answered all pt questions. Pt would benefit from acute OT to address LB ADLs and increase activity tolerance. Recommend dc home once medically stable per physician.     Follow Up Recommendations  No OT follow up;Supervision/Assistance - 24 hour    Equipment Recommendations  None recommended by OT    Recommendations for Other Services       Precautions / Restrictions Precautions Precautions: Fall Restrictions Weight Bearing Restrictions: No      Mobility Bed Mobility               General bed mobility comments: Patient in chair.  Transfers Overall transfer level: Modified independent Equipment used: None             General transfer comment: Increased time    Balance Overall balance assessment: Needs assistance   Sitting balance-Leahy Scale: Good     Standing balance support: Single extremity supported Standing balance-Leahy Scale: Fair Standing balance comment: Pt able to perform LB dressing and maintain standing balance without UE support                           ADL either performed or assessed with clinical judgement   ADL Overall ADL's : Needs assistance/impaired Eating/Feeding: Set up;Sitting Eating/Feeding Details (indicate cue type and reason): Pt eating breakfast in recliner upon arrival Grooming: Brushing hair;Set up;Sitting   Upper Body Bathing: Set up;Sitting    Lower Body Bathing: Minimal assistance;Sit to/from stand   Upper Body Dressing : Set up;Sitting Upper Body Dressing Details (indicate cue type and reason): Pt donned bra and shirt Lower Body Dressing: Minimal assistance;Sit to/from stand Lower Body Dressing Details (indicate cue type and reason): Pt donned underwear without physical A. Required Min A for pants due to increased difficulty manipulate RLE. Feel pt would benefit from AE. Toilet Transfer: Min guard (simulated to recliner) Statistician Details (indicate cue type and reason): NT reports that pt walks to and from bathroom without physical A.            General ADL Comments: Pt very pleasant and willing to work with OT. Decreased fucntional performance in standing due to need for UE support for balance.     Vision Baseline Vision/History: Wears glasses Wears Glasses: Reading only Patient Visual Report: No change from baseline       Perception     Praxis      Pertinent Vitals/Pain Pain Assessment: No/denies pain     Hand Dominance Right   Extremity/Trunk Assessment Upper Extremity Assessment Upper Extremity Assessment: Overall WFL for tasks assessed   Lower Extremity Assessment Lower Extremity Assessment: Generalized weakness       Communication Communication Communication: No difficulties   Cognition Arousal/Alertness: Awake/alert Behavior During Therapy: WFL for tasks assessed/performed Overall Cognitive Status: Within Functional Limits for tasks assessed (for basic tasks)  General Comments       Exercises    Shoulder Instructions      Home Living Family/patient expects to be discharged to:: Private residence Living Arrangements: Other relatives (Grandson) Available Help at Discharge: Family;Friend(s);Available PRN/intermittently (grandson, daughter, and friends) Type of Home: House ([place]) Home Access: Level entry     Home Layout:  One level     Bathroom Shower/Tub: Walk-in Human resources officer: Standard     Home Equipment: Cane - single point;Wheelchair - manual;Grab bars - tub/shower          Prior Functioning/Environment Level of Independence: Independent        Comments: ADLs and IADLs. Needs assist for transportation from grandson, daughter, or friends.        OT Problem List: Decreased strength;Decreased range of motion;Decreased activity tolerance;Impaired balance (sitting and/or standing);Decreased safety awareness;Decreased knowledge of use of DME or AE;Decreased knowledge of precautions;Pain      OT Treatment/Interventions: Self-care/ADL training;Therapeutic exercise;Energy conservation;DME and/or AE instruction;Therapeutic activities;Patient/family education    OT Goals(Current goals can be found in the care plan section) Acute Rehab OT Goals Patient Stated Goal: To go home OT Goal Formulation: With patient Time For Goal Achievement: 10/29/16 Potential to Achieve Goals: Good ADL Goals Pt Will Perform Grooming: with set-up;with supervision;standing Pt Will Perform Lower Body Dressing: with set-up;with supervision;with adaptive equipment;sit to/from stand Pt Will Transfer to Toilet: with set-up;with supervision;regular height toilet;ambulating Pt Will Perform Tub/Shower Transfer: Shower transfer;ambulating;rolling walker;with min guard assist  OT Frequency: Min 2X/week   Barriers to D/C:            Co-evaluation              AM-PAC PT "6 Clicks" Daily Activity     Outcome Measure Help from another person eating meals?: None Help from another person taking care of personal grooming?: A Little Help from another person toileting, which includes using toliet, bedpan, or urinal?: A Little Help from another person bathing (including washing, rinsing, drying)?: A Little Help from another person to put on and taking off regular upper body clothing?: A Little Help from another  person to put on and taking off regular lower body clothing?: None 6 Click Score: 20   End of Session Equipment Utilized During Treatment: Rolling walker Nurse Communication: Mobility status  Activity Tolerance: Patient tolerated treatment well Patient left: in chair;with call bell/phone within reach;with chair alarm set  OT Visit Diagnosis: Unsteadiness on feet (R26.81);Other abnormalities of gait and mobility (R26.89);Muscle weakness (generalized) (M62.81)                Time: 1610-9604 OT Time Calculation (min): 32 min Charges:  OT General Charges $OT Visit: 1 Procedure OT Evaluation $OT Eval Low Complexity: 1 Procedure OT Treatments $Self Care/Home Management : 8-22 mins G-Codes:     Brittney Lopez MSOT, OTR/L Acute Rehab Pager: [phone] Office: [phone]  Brittney Lopez Brittney Lopez 10/15/2016, 8:33 AM

## 2016-10-15 NOTE — Progress Notes (Signed)
Patient without complaint during day shift, walked in hallways with standby assist from RN.  Patient is anxious to go home, says she feels fine, continues to diurese.

## 2016-10-15 NOTE — Care Management Note (Signed)
Case Management Note  Patient Details  Name: Brittney Lopez MRN: [id number] Date of Birth: 07/18/1928  Subjective/Objective:                  acute systolic and diastolic heart failure, with LVEF now 25% Action/Plan: Discharge planning Expected Discharge Date:                  Expected Discharge Plan:  [place]  In-House Referral:     Discharge planning Services  CM Consult  Post Acute Care Choice:  Hospice Choice offered to:  Patient  DME Arranged:  Overbed table DME Agency:  NA  HH Arranged:    [place]  Status of Service:  In process, will continue to follow  If discussed at Long Length of Stay Meetings, dates discussed:    Additional Comments: CM met with pt in room to ofer choice and family chooses [facility].  Referral called to [facility] and facesheet, H&P, and order faxed to [phone] per Bambi's request.  Brittney Lopez is daughter and pt has given permission for Brittney Lopez to make all arrangements and can be reached at [phone].  Pt has all DME at home with exception of overbed table tray. Pt is NOT O2 dependent.  Brittney Lopez will provide transportation home. No other CM needs were communicated.  Dellie Catholic, RN 10/15/2016, 11:06 AM

## 2016-10-15 NOTE — Plan of Care (Signed)
Problem: Health Behavior/Discharge Planning: Goal: Ability to manage health-related needs will improve Outcome: Progressing Anticipate patient going home with support from home health or hospice

## 2016-10-16 DIAGNOSIS — L03115 Cellulitis of right lower limb: Secondary | ICD-10-CM

## 2016-10-16 LAB — BASIC METABOLIC PANEL
ANION GAP: 12 (ref 5–15)
BUN: 25 mg/dL — AB (ref 6–20)
CALCIUM: 8.5 mg/dL — AB (ref 8.9–10.3)
CO2: 31 mmol/L (ref 22–32)
CREATININE: 1.28 mg/dL — AB (ref 0.44–1.00)
Chloride: 96 mmol/L — ABNORMAL LOW (ref 101–111)
GFR calc Af Amer: 43 mL/min — ABNORMAL LOW (ref >60)
GFR, EST NON AFRICAN AMERICAN: 37 mL/min — AB (ref >60)
GLUCOSE: 147 mg/dL — AB (ref 65–99)
Potassium: 4.7 mmol/L (ref 3.5–5.1)
Sodium: 139 mmol/L (ref 135–145)

## 2016-10-16 LAB — GLUCOSE, CAPILLARY
GLUCOSE-CAPILLARY: 140 mg/dL — AB (ref 65–99)
Glucose-Capillary: 126 mg/dL — ABNORMAL HIGH (ref 65–99)
Glucose-Capillary: 156 mg/dL — ABNORMAL HIGH (ref 65–99)
Glucose-Capillary: 173 mg/dL — ABNORMAL HIGH (ref 65–99)

## 2016-10-16 MED ORDER — CARVEDILOL 6.25 MG PO TABS
6.2500 mg | ORAL_TABLET | Freq: Two times a day (BID) | ORAL | Status: DC
  Administered 2016-10-17 – 2016-10-18 (×3): 6.25 mg via ORAL
  Filled 2016-10-16 (×4): qty 1

## 2016-10-16 MED ORDER — CARVEDILOL 12.5 MG PO TABS: 12.5000 mg | ORAL_TABLET | Freq: Two times a day (BID) | ORAL | Status: DC

## 2016-10-16 NOTE — Care Management Important Message (Signed)
Important Message  Patient Details  Name: Brittney Lopez MRN: [id number] Date of Birth: July 29, 1928   Medicare Important Message Given:  Yes    Bransen Fassnacht 10/16/2016, 12:04 PM

## 2016-10-16 NOTE — Progress Notes (Signed)
Progress Note  Patient Name: Brittney Lopez Date of Encounter: 10/16/2016  Primary Cardiologist: Dr. Mayford Knife  Subjective   No complaints of CP or SOB  Inpatient Medications    Scheduled Meds: . aspirin EC  81 mg Oral Daily  . furosemide  80 mg Intravenous Q12H  . heparin  5,000 Units Subcutaneous Q8H  . insulin aspart  0-5 Units Subcutaneous QHS  . insulin aspart  0-9 Units Subcutaneous TID WC  . lactobacillus acidophilus  2 tablet Oral TID  . linagliptin  5 mg Oral Daily  . metoprolol succinate  100 mg Oral Daily  . multivitamin with minerals  1 tablet Oral Daily  . nystatin ointment   Topical BID  . rosuvastatin  10 mg Oral Daily  . saccharomyces boulardii  250 mg Oral BID   Continuous Infusions:  PRN Meds: acetaminophen, nitroGLYCERIN, ondansetron (ZOFRAN) IV   Vital Signs    Vitals:   10/15/16 2139 10/16/16 0536 10/16/16 0923 10/16/16 0937  BP: 133/66 129/60 (!) 96/36 (!) 98/47  Pulse: 73 72 68   Resp: 18 16    Temp: 98.4 F (36.9 C) 97.9 F (36.6 C)    TempSrc: Oral Oral    SpO2: 97% 95%    Weight:  202 lb 4.8 oz (91.8 kg)    Height:        Intake/Output Summary (Last 24 hours) at 10/16/16 0956 Last data filed at 10/16/16 0805  Gross per 24 hour  Intake              720 ml  Output             1402 ml  Net             -682 ml   Filed Weights   10/14/16 0614 10/15/16 0425 10/16/16 0536  Weight: 206 lb 3.2 oz (93.5 kg) 204 lb 8 oz (92.8 kg) 202 lb 4.8 oz (91.8 kg)    Telemetry    NSR - Personally Reviewed  ECG    No new EKG to reveiw - Personally Reviewed  Physical Exam   GEN: No acute distress.   Neck: No JVD Cardiac: RRR, no murmurs, rubs, or gallops.  Respiratory: Clear to auscultation bilaterally. GI: Soft, nontender, non-distended  MS: 1-2+ edema bilaterally with erythema of BLE; No deformity. Neuro:  Nonfocal  Psych: Normal affect   Labs    Chemistry Recent Labs Lab 10/10/16 1836  10/14/16 0548 10/15/16 0229  10/16/16 0230  NA 141  < > 139 138 139  K 3.6  < > 3.8 3.5 4.7  CL 103  < > 98* 95* 96*  CO2 28  < > 29 32 31  GLUCOSE 145*  < > 115* 142* 147*  BUN 19  < > 21* 29* 25*  CREATININE 1.58*  < > 1.34* 1.54* 1.28*  CALCIUM 8.7*  < > 8.2* 8.4* 8.5*  PROT 6.5  --   --   --   --   ALBUMIN 3.8  --   --   --   --   AST 28  --   --   --   --   ALT 20  --   --   --   --   ALKPHOS 49  --   --   --   --   BILITOT 0.8  --   --   --   --   GFRNONAA 29*  < > 35* 30* 37*  GFRAA  33*  < > 41* 34* 43*  ANIONGAP 10  < > 12 11 12   < > = values in this interval not displayed.   Hematology Recent Labs Lab 10/10/16 1836 10/15/16 0229  WBC 8.2 8.9  RBC 4.32 4.34  HGB 12.9 13.0  HCT 40.3 40.7  MCV 93.3 93.8  MCH 29.9 30.0  MCHC 32.0 31.9  RDW 15.1 14.7  PLT 241 254    Cardiac EnzymesNo results for input(s): TROPONINI in the last 168 hours. No results for input(s): TROPIPOC in the last 168 hours.   BNP Recent Labs Lab 10/10/16 1836  BNP 3,937.3*     DDimer No results for input(s): DDIMER in the last 168 hours.   Radiology    No results found.  Cardiac Studies  2D echo Study Conclusions  - Left ventricle: The cavity size was moderately dilated. Wall   thickness was normal. The estimated ejection fraction was 25%.   Diffuse hypokinesis. Doppler parameters are consistent with both   elevated ventricular end-diastolic filling pressure and elevated   left atrial filling pressure. - Mitral valve: Calcified annulus. There was mild regurgitation. - Left atrium: The atrium was moderately dilated. - Atrial septum: No defect or patent foramen ovale was identified. - Tricuspid valve: There was moderate regurgitation.   Patient Profile     83 y.o. female  Hx of CAD (s/p CABG), HTN, HL CKD stage 3 admited with CHF exacerbation    Assessment & Plan    1  Acute on chronic systolic / diastolic CHF  LVEF 25%    - she is negative 1.7L yesterday and net neg 6.2L from admit.   - weight  down 16lbs from admit (218>>202).   - repeat BNP down from 4245 to 3937 so would continue on IV diuretics.  - continue BB but change to Carvedilol 6.255mg  BID starting tomorrow.  Hopefully on lower dose of BB we may be able to add hydralazine for afterload reduction.   - No ACE I secondary to CKD.   - BP too soft to add hydralazine /nitrates.  2  CAD  S/p CABG   - Dr. Tenny Craw spoke with patient's daughter - apparently patient does not remember having conversation with Dr. Duke Salvia in regareds to possible cath - patient has dementia and without any CP and with limited insight into her medical issues as well as advanced age - it was decided to try maximizing medical therapy and avoid invasive evaluation.  - Pt needs to have help once leaves the hosp to assure meds taken, keeps on correct diet, fluid restriction - Case management on board - continue ASA, BB, statin  3  Right leg cellulitis and swelling - s/p completion of Clindamycin Rx per IM.   - RLE venous duplex negative. - continue SQ Heparin per pharmacy for DVT propylaxis.   Signed, Armanda Magic, MD  10/16/2016, 9:56 AM

## 2016-10-16 NOTE — Progress Notes (Signed)
Physical Therapy Treatment Patient Details Name: Brittney Lopez MRN: [id number] DOB: November 16, 1928 Today's Date: 10/16/2016    History of Present Illness 83 y.o. female who presents w/ a past medical history significant for CHF, CKD, CAD/CABG, DM, HLD, HTN, obesity, and Pulmonary HTN. Pt admitted on 10/10/16 for decompensated CHF and cellulitis in RLE.    PT Comments    Pt with improving mobility and activity tolerance.   Follow Up Recommendations  Home health PT;Supervision for mobility/OOB     Equipment Recommendations  None recommended by PT    Recommendations for Other Services       Precautions / Restrictions Precautions Precautions: Fall Restrictions Weight Bearing Restrictions: No    Mobility  Bed Mobility               General bed mobility comments: Patient in chair.  Transfers Overall transfer level: Modified independent Equipment used: None             General transfer comment: Increased time  Ambulation/Gait Ambulation/Gait assistance: Min guard Ambulation Distance (Feet): 170 Feet Assistive device: 1 person hand held assist Gait Pattern/deviations: Step-through pattern;Decreased stride length;Drifts right/left Gait velocity: decreased Gait velocity interpretation: Below normal speed for age/gender General Gait Details: Pt with slightly unsteady gait. Pt refused RW or cane and prefers using hand held or Insurance account manager Rank[place] (Stroke Patients Only)       Balance Overall balance assessment: Needs assistance Sitting-balance support: No upper extremity supported;Feet supported Sitting balance-Leahy Scale: Good     Standing balance support: No upper extremity supported;During functional activity Standing balance-Leahy Scale: Fair Standing balance comment: Pt able to stand and tie robe with steady stance                            Cognition Arousal/Alertness:  Awake/alert Behavior During Therapy: WFL for tasks assessed/performed Overall Cognitive Status: History of cognitive impairments - at baseline                                        Exercises      General Comments        Pertinent Vitals/Pain      Home Living                      Prior Function            PT Goals (current goals can now be found in the care plan section) Progress towards PT goals: Progressing toward goals    Frequency    Min 3X/week      PT Plan Current plan remains appropriate    Co-evaluation              AM-PAC PT "6 Clicks" Daily Activity  Outcome Measure  Difficulty turning over in bed (including adjusting bedclothes, sheets and blankets)?: None Difficulty moving from lying on back to sitting on the side of the bed? : None Difficulty sitting down on and standing up from a chair with arms (e.g., wheelchair, bedside commode, etc,.)?: None Help needed moving to and from a bed to chair (including a wheelchair)?: A Little Help needed walking in hospital room?: A Little Help needed climbing 3-5 steps with a railing? : A Little 6 Click Score:  21    End of Session Equipment Utilized During Treatment: Gait belt Activity Tolerance: Patient tolerated treatment well Patient left: in chair;with call bell/phone within reach;with bed alarm set   PT Visit Diagnosis: Unsteadiness on feet (R26.81);Other abnormalities of gait and mobility (R26.89);Muscle weakness (generalized) (M62.81);Ataxic gait (R26.0)     Time: 1610-9604 PT Time Calculation (min) (ACUTE ONLY): 10 min  Charges:  $Gait Training: 8-22 mins                    G Codes:       [facility] PT [phone]    Angelina Ok [facility] 10/16/2016, 1:47 PM

## 2016-10-16 NOTE — Progress Notes (Signed)
Patients SBP on  The  90's (see flowsheet), pt asymptomatic, sitting up on the chair, no complaints any discomfort. Family at bedside. Will monitor accordingly.

## 2016-10-16 NOTE — Progress Notes (Signed)
Heart Failure Navigator Consult Note  Presentation: Brittney Lopez is a 83 y.o.femalehistory of ASCAD s/p remote CABG, HTN, dyslipidemia, chronic diastolic CHF, CKD stage III, LE edema and HLD Presents for ER follow up.   Last echocardiogram 08/2013 showed normal LV function to 55-60% and grade 1 diastolic dysfunction.  Started on abx by Dr. Mayford Knife for RLE cellulitis and advised to go ER where his troponin was 0.11. Repeat level remained stable at 0.11 x 3. Elevated d-dimer. No DVT on doppler. BNP of 4245. Recommended admission however decided to go home.   She continues to have increasing lower extremity edema despite on increased Lasix po 40mg  for past 3 days. She also has dyspnea. No orthopnea, PND or syncope. Compliant with low sodium diet  Past Medical History:  Diagnosis Date  . Chronic diastolic CHF (congestive heart failure) (HCC)    grade II diastolic dysfunction on echo 08/2012   . CKD (chronic kidney disease), stage III   . Coronary artery disease    s/p CABG  . DM (diabetes mellitus) type I controlled with eye manifestation (HCC)    diabetic proliferative retinopathy  . Dyslipidemia   . Hypertension   . Ischemic cardiomyopathy    Resolved with EF 60% by Echo 2007  . Memory loss    MIld,MMSE 12/19/11 difficulty clock drawing,normal animal fluency  . Obesity   . Pulmonary HTN (HCC)    mild with PASP 40-57mmHg echo 08/2012 and resolved by echo 2015    Social History   Social History  . Marital status: Divorced    Spouse name: N/A  . Number of children: 4  . Years of education: 12   Occupational History  . retired    Social History Main Topics  . Smoking status: Never Smoker  . Smokeless tobacco: Never Used  . Alcohol use No  . Drug use: No  . Sexual activity: Not Asked   Other Topics Concern  . None   Social History Narrative  . None    ECHO:Study Conclusions--10/11/16  - Left ventricle: The cavity size was moderately dilated. Wall   thickness  was normal. The estimated ejection fraction was 25%.   Diffuse hypokinesis. Doppler parameters are consistent with both   elevated ventricular end-diastolic filling pressure and elevated   left atrial filling pressure. - Mitral valve: Calcified annulus. There was mild regurgitation. - Left atrium: The atrium was moderately dilated. - Atrial septum: No defect or patent foramen ovale was identified. - Tricuspid valve: There was moderate regurgitation.  ------------------------------------------------------------------- Labs, prior tests, procedures, and surgery: Coronary artery bypass grafting.  ------------------------------------------------------------------- Study data:  Comparison was made to the study of 09/03/2013.  Study status:  Routine.  Procedure:  Transthoracic echocardiography. Image quality was adequate.  Study completion:  There were no complications.          Transthoracic echocardiography.  M-mode, complete 2D, spectral Doppler, and color Doppler.  Birthdate: Patient birthdate: 30-Sep-1928.  Age:  Patient is 83 yr old.  Sex: Gender: female.    BMI: 40.9 kg/m^2.  Blood pressure:     138/69 Patient status:  Inpatient.  Study date:  Study date: 10/11/2016. Study time: 11:50 AM.  Location:  Bedside.  BNP    Component Value Date/Time   BNP 3,937.3 (H) 10/10/2016 1836    ProBNP No results found for: PROBNP   Education Assessment and Provision:  Detailed education and instructions provided on heart failure disease management including the following:  Signs and symptoms of Heart Failure When  to call the physician Importance of daily weights Low sodium diet Fluid restriction Medication management Anticipated future follow-up appointments  Patient education given on each of the above topics.  Patient acknowledges understanding and acceptance of all instructions.  I spoke with Ms. Maclay regarding her HF.  She was not very engaged in the conversation and mostly  nodded in agreement with the information that I provided.  According to the nurse-she is slightly confused at times and therefore I will attempt to return to educate family members for reinforcement of the information.  Education Materials:  "Living Better With Heart Failure" Booklet, Daily Weight Tracker Tool    High Risk Criteria for Readmission and/or Poor Patient Outcomes:   EF <30%- 25%  2 or more admissions in 6 months-No  Difficult social situation-? No  Demonstrates medication noncompliance-Denies    Barriers of Care:  Knowledge and compliance  Discharge Planning:   Plans to discharge to home with Grandson in [place].

## 2016-10-16 NOTE — Progress Notes (Signed)
Received call from [facility] with [facility], they have accepted referral and can see her today; Alexis Goodell [phone]

## 2016-10-17 LAB — GLUCOSE, CAPILLARY
GLUCOSE-CAPILLARY: 155 mg/dL — AB (ref 65–99)
Glucose-Capillary: 132 mg/dL — ABNORMAL HIGH (ref 65–99)
Glucose-Capillary: 152 mg/dL — ABNORMAL HIGH (ref 65–99)
Glucose-Capillary: 154 mg/dL — ABNORMAL HIGH (ref 65–99)

## 2016-10-17 NOTE — Progress Notes (Signed)
Progress Note  Patient Name: Brittney Lopez Date of Encounter: 10/17/2016  Primary Cardiologist: Dr. Mayford Knife  Subjective   No complaints of CP or SOB.  She feels great.  Ambulating in room without any problems.  Inpatient Medications    Scheduled Meds: . aspirin EC  81 mg Oral Daily  . carvedilol  6.25 mg Oral BID WC  . furosemide  80 mg Intravenous Q12H  . heparin  5,000 Units Subcutaneous Q8H  . insulin aspart  0-5 Units Subcutaneous QHS  . insulin aspart  0-9 Units Subcutaneous TID WC  . lactobacillus acidophilus  2 tablet Oral TID  . linagliptin  5 mg Oral Daily  . multivitamin with minerals  1 tablet Oral Daily  . nystatin ointment   Topical BID  . rosuvastatin  10 mg Oral Daily  . saccharomyces boulardii  250 mg Oral BID   Continuous Infusions:  PRN Meds: acetaminophen, nitroGLYCERIN, ondansetron (ZOFRAN) IV   Vital Signs    Vitals:   10/16/16 1227 10/16/16 2050 10/17/16 0619 10/17/16 0758  BP: (!) 118/52 130/62 (!) 115/54 (!) 124/57  Pulse: 75 72 74 76  Resp: 18 17    Temp: 97.7 F (36.5 C) 97.9 F (36.6 C) 98.4 F (36.9 C)   TempSrc: Oral Oral Oral   SpO2: 100% 97% 99%   Weight:   201 lb 1.6 oz (91.2 kg)   Height:        Intake/Output Summary (Last 24 hours) at 10/17/16 1058 Last data filed at 10/17/16 0849  Gross per 24 hour  Intake              580 ml  Output             1300 ml  Net             -720 ml   Filed Weights   10/15/16 0425 10/16/16 0536 10/17/16 0619  Weight: 204 lb 8 oz (92.8 kg) 202 lb 4.8 oz (91.8 kg) 201 lb 1.6 oz (91.2 kg)    Telemetry    NSR - Personally Reviewed  ECG    No new EKG to review- Personally Reviewed  Physical Exam   GEN: WD, obese in NAD Neck: No JVD or bruit Cardiac: RRR with no M/R/G Respiratory: CTA bilaterally GI: soft, NT, ND with active BS MS: trace edema LE R>L with resolving erythema Neuro:  A&O x 3 Psych: normal mood  Labs    Chemistry Recent Labs Lab 10/10/16 1836   10/14/16 0548 10/15/16 0229 10/16/16 0230  NA 141  < > 139 138 139  K 3.6  < > 3.8 3.5 4.7  CL 103  < > 98* 95* 96*  CO2 28  < > 29 32 31  GLUCOSE 145*  < > 115* 142* 147*  BUN 19  < > 21* 29* 25*  CREATININE 1.58*  < > 1.34* 1.54* 1.28*  CALCIUM 8.7*  < > 8.2* 8.4* 8.5*  PROT 6.5  --   --   --   --   ALBUMIN 3.8  --   --   --   --   AST 28  --   --   --   --   ALT 20  --   --   --   --   ALKPHOS 49  --   --   --   --   BILITOT 0.8  --   --   --   --  GFRNONAA 29*  < > 35* 30* 37*  GFRAA 33*  < > 41* 34* 43*  ANIONGAP 10  < > 12 11 12   < > = values in this interval not displayed.   Hematology  Recent Labs Lab 10/10/16 1836 10/15/16 0229  WBC 8.2 8.9  RBC 4.32 4.34  HGB 12.9 13.0  HCT 40.3 40.7  MCV 93.3 93.8  MCH 29.9 30.0  MCHC 32.0 31.9  RDW 15.1 14.7  PLT 241 254    Cardiac EnzymesNo results for input(s): TROPONINI in the last 168 hours. No results for input(s): TROPIPOC in the last 168 hours.   BNP  Recent Labs Lab 10/10/16 1836  BNP 3,937.3*     DDimer No results for input(s): DDIMER in the last 168 hours.   Radiology    No results found.  Cardiac Studies  2D echo Study Conclusions  - Left ventricle: The cavity size was moderately dilated. Wall   thickness was normal. The estimated ejection fraction was 25%.   Diffuse hypokinesis. Doppler parameters are consistent with both   elevated ventricular end-diastolic filling pressure and elevated   left atrial filling pressure. - Mitral valve: Calcified annulus. There was mild regurgitation. - Left atrium: The atrium was moderately dilated. - Atrial septum: No defect or patent foramen ovale was identified. - Tricuspid valve: There was moderate regurgitation.   Patient Profile     83 y.o. female  Hx of CAD (s/p CABG), HTN, HL CKD stage 3 admited with CHF exacerbation    Assessment & Plan    1  Acute on chronic systolic / diastolic CHF  LVEF 25%    - she is negative 1L yesterday and net neg  7.1L from admit.   - weight down 17lbs from admit (218>>201).   - repeat BNP down from 4245 to 3937 so would continue on IV diuretics.  - changing from lopressor to  Carvedilol 6.25mg  BID starting today.  Hopefully on lower dose of BB we may be able to add hydralazine for afterload reduction.   - No ACE I secondary to CKD.  Creatinine improving with diuresis. - BP improved - if stable today on Carvedilol then will add Hydralazine low dose. - check BMET today. - I think we will probably be able to change to PO Lasix in am and possible d/c home  2  CAD  S/p CABG   - Dr. Tenny Craw spoke with patient's daughter - apparently patient does not remember having conversation with Dr. Duke Salvia in regareds to possible cath - patient has dementia and without any CP and with limited insight into her medical issues as well as advanced age - it was decided to try maximizing medical therapy and avoid invasive evaluation.  - Pt needs to have help once leaves the hosp to assure meds taken, keeps on correct diet, fluid restriction - Case management on board - continue ASA, BB, statin  3  Right leg cellulitis and swelling - much improved with erythma resolving and only trace edema. - s/p completion of Clindamycin Rx per IM.   - RLE venous duplex negative. - continue SQ Heparin per pharmacy for DVT propylaxis.   Signed, Armanda Magic, MD  10/17/2016, 10:58 AM

## 2016-10-17 NOTE — Progress Notes (Signed)
Occupational Therapy Treatment Patient Details Name: Brittney Lopez MRN: [id number] DOB: 12-Nov-1928 Today's Date: 10/17/2016    History of present illness 83 y.o. female who presents w/ a past medical history significant for CHF, CKD, CAD/CABG, DM, HLD, HTN, obesity, and Pulmonary HTN. Pt admitted on 10/10/16 for decompensated CHF and cellulitis in RLE.   OT comments  Pt demonstrates adequate level to return home and have family (A). Pt will continue to be seen by OT regarding safety with RW during transfers/ basic ADLs   Follow Up Recommendations  No OT follow up;Supervision/Assistance - 24 hour    Equipment Recommendations  None recommended by OT    Recommendations for Other Services      Precautions / Restrictions Precautions Precautions: Fall       Mobility Bed Mobility               General bed mobility comments: in chair  Transfers Overall transfer level: Modified independent               General transfer comment: incr time. pushing RW very far out and widen base with a rocking momentum to complete sit<>Stand    Balance Overall balance assessment: Needs assistance           Standing balance-Leahy Scale: Fair                             ADL either performed or assessed with clinical judgement   ADL Overall ADL's : Needs assistance/impaired     Grooming: Wash/dry face;Wash/dry hands;Supervision/safety                   Toilet Transfer: Min Proofreader Details (indicate cue type and reason): cues for safety with RW. Pt attempting to hold sink instead of RW. P Toileting- Clothing Manipulation and Hygiene: Supervision/safety       Functional mobility during ADLs: Supervision/safety General ADL Comments: pt needed cues throughout session for safety with RW. Pt able to use RW but at times attempting to abandon RW to hold furniture and environmental supports     Vision       Perception     Praxis      Cognition Arousal/Alertness: Awake/alert Behavior During Therapy: WFL for tasks assessed/performed Overall Cognitive Status: History of cognitive impairments - at baseline                                          Exercises     Shoulder Instructions       General Comments      Pertinent Vitals/ Pain       Pain Assessment: No/denies pain  Home Living                                          Prior Functioning/Environment              Frequency  Min 2X/week        Progress Toward Goals  OT Goals(current goals can now be found in the care plan section)  Progress towards OT goals: Progressing toward goals  Acute Rehab OT Goals Patient Stated Goal: To go home OT Goal Formulation: With patient Time For Goal Achievement: 10/29/16 Potential to Achieve Goals:  Good ADL Goals Pt Will Perform Grooming: with set-up;with supervision;standing Pt Will Perform Lower Body Dressing: with set-up;with supervision;with adaptive equipment;sit to/from stand Pt Will Transfer to Toilet: with set-up;with supervision;regular height toilet;ambulating Pt Will Perform Tub/Shower Transfer: Shower transfer;ambulating;rolling walker;with min guard assist  Plan Discharge plan remains appropriate    Co-evaluation                 AM-PAC PT "6 Clicks" Daily Activity     Outcome Measure   Help from another person eating meals?: None Help from another person taking care of personal grooming?: A Little Help from another person toileting, which includes using toliet, bedpan, or urinal?: A Little Help from another person bathing (including washing, rinsing, drying)?: A Little Help from another person to put on and taking off regular upper body clothing?: A Little Help from another person to put on and taking off regular lower body clothing?: None 6 Click Score: 20    End of Session Equipment Utilized During Treatment: Rolling walker  OT Visit  Diagnosis: Unsteadiness on feet (R26.81);Other abnormalities of gait and mobility (R26.89);Muscle weakness (generalized) (M62.81)   Activity Tolerance Patient tolerated treatment well   Patient Left in chair;with call bell/phone within reach;with chair alarm set   Nurse Communication Mobility status        Time: 0211-0221 OT Time Calculation (min): 10 min  Charges: OT General Charges $OT Visit: 1 Procedure OT Treatments $Self Care/Home Management : 8-22 mins   Mateo Flow   OTR/L Pager: [phone] Office: [phone] .    Boone Master B 10/17/2016, 3:32 PM

## 2016-10-17 NOTE — Progress Notes (Signed)
Patient with no complaints or concerns during 7pm - 7am shift. Slept during the night.   Dajon Lazar, RN 

## 2016-10-17 NOTE — Progress Notes (Signed)
Physical Therapy Treatment Patient Details Name: Brittney Lopez MRN: [id number] DOB: [birth year] Today's Date: 10/17/2016    History of Present Illness 83 y.o. female who presents w/ a past medical history significant for CHF, CKD, CAD/CABG, DM, HLD, HTN, obesity, and Pulmonary HTN. Pt admitted on 10/10/16 for decompensated CHF and cellulitis in RLE.    PT Comments    Patient tolerated an increase in gait distance. Continues to be unsteady with gait and relies on single UE support. Pt denies need for RW however with LOB with horizontal head turns with HHA. Discussed need to use RW especially initially upon d/c due to increased weakness and to decrease risk of falls. Continue to progress as tolerated with anticipated d/c home with HHPT.    Follow Up Recommendations  Home health PT;Supervision for mobility/OOB     Equipment Recommendations  RW    Recommendations for Other Services       Precautions / Restrictions Precautions Precautions: Fall Restrictions Weight Bearing Restrictions: No    Mobility  Bed Mobility               General bed mobility comments: Patient in chair.  Transfers Overall transfer level: Modified independent Equipment used: None             General transfer comment: Increased time  Ambulation/Gait Ambulation/Gait assistance: Min assist Ambulation Distance (Feet): 200 Feet Assistive device: 1 person hand held assist (and use of rail in hallway ) Gait Pattern/deviations: Step-through pattern;Decreased stride length;Drifts right/left Gait velocity: decreased   General Gait Details: 2 standing rest breaks; pt unsteady and reaching for objects to hold onto--reports she normally furniture walks at home; HHA or rail in hallway used to steady; LOB with horizontal head turns with single UE support   Stairs            Wheelchair Mobility    Modified Rankin (Stroke Patients Only)       Balance Overall balance assessment: Needs  assistance Sitting-balance support: No upper extremity supported;Feet supported Sitting balance-Leahy Scale: Good     Standing balance support: No upper extremity supported;During functional activity Standing balance-Leahy Scale: Fair Standing balance comment: static standing                             Cognition Arousal/Alertness: Awake/alert Behavior During Therapy: WFL for tasks assessed/performed Overall Cognitive Status: History of cognitive impairments - at baseline                                        Exercises      General Comments        Pertinent Vitals/Pain Pain Assessment: Faces Faces Pain Scale: Hurts a little bit Pain Location: ankles Pain Descriptors / Indicators: Sore Pain Intervention(s): Monitored during session    Home Living                      Prior Function            PT Goals (current goals can now be found in the care plan section) Progress towards PT goals: Progressing toward goals    Frequency    Min 3X/week      PT Plan Current plan remains appropriate    Co-evaluation              AM-PAC PT "6 Clicks" Daily  Activity  Outcome Measure  Difficulty turning over in bed (including adjusting bedclothes, sheets and blankets)?: None Difficulty moving from lying on back to sitting on the side of the bed? : None Difficulty sitting down on and standing up from a chair with arms (e.g., wheelchair, bedside commode, etc,.)?: None Help needed moving to and from a bed to chair (including a wheelchair)?: A Little Help needed walking in hospital room?: A Little Help needed climbing 3-5 steps with a railing? : A Little 6 Click Score: 21    End of Session Equipment Utilized During Treatment: Gait belt Activity Tolerance: Patient tolerated treatment well Patient left: in chair;with call bell/phone within reach;with chair alarm set Nurse Communication: Mobility status PT Visit Diagnosis: Unsteadiness  on feet (R26.81);Other abnormalities of gait and mobility (R26.89);Muscle weakness (generalized) (M62.81);Ataxic gait (R26.0)     Time: 7001-7494 PT Time Calculation (min) (ACUTE ONLY): 21 min  Charges:  $Gait Training: 8-22 mins                    G Codes:       Erline Levine, PTA Pager: [phone]     Carolynne Edouard 10/17/2016, 10:59 AM

## 2016-10-17 NOTE — Plan of Care (Signed)
Problem: Safety: Goal: Ability to remain free from injury will improve Outcome: Progressing Fall prevention protocol implemented.

## 2016-10-18 ENCOUNTER — Telehealth: Payer: Self-pay | Admitting: Cardiology

## 2016-10-18 ENCOUNTER — Other Ambulatory Visit: Payer: Self-pay | Admitting: Physician Assistant

## 2016-10-18 DIAGNOSIS — I5032 Chronic diastolic (congestive) heart failure: Secondary | ICD-10-CM

## 2016-10-18 LAB — GLUCOSE, CAPILLARY
GLUCOSE-CAPILLARY: 150 mg/dL — AB (ref 65–99)
Glucose-Capillary: 166 mg/dL — ABNORMAL HIGH (ref 65–99)

## 2016-10-18 LAB — BASIC METABOLIC PANEL
ANION GAP: 12 (ref 5–15)
BUN: 25 mg/dL — ABNORMAL HIGH (ref 6–20)
CALCIUM: 8.1 mg/dL — AB (ref 8.9–10.3)
CO2: 29 mmol/L (ref 22–32)
CREATININE: 1.22 mg/dL — AB (ref 0.44–1.00)
Chloride: 99 mmol/L — ABNORMAL LOW (ref 101–111)
GFR calc non Af Amer: 39 mL/min — ABNORMAL LOW (ref >60)
GFR, EST AFRICAN AMERICAN: 45 mL/min — AB (ref >60)
Glucose, Bld: 134 mg/dL — ABNORMAL HIGH (ref 65–99)
Potassium: 3.3 mmol/L — ABNORMAL LOW (ref 3.5–5.1)
SODIUM: 140 mmol/L (ref 135–145)

## 2016-10-18 MED ORDER — POTASSIUM CHLORIDE CRYS ER 20 MEQ PO TBCR
40.0000 meq | EXTENDED_RELEASE_TABLET | Freq: Once | ORAL | Status: AC
  Administered 2016-10-18: 40 meq via ORAL
  Filled 2016-10-18: qty 2

## 2016-10-18 MED ORDER — HYDRALAZINE HCL 10 MG PO TABS: 10.0000 mg | ORAL_TABLET | Freq: Three times a day (TID) | ORAL | 3 refills | Status: DC

## 2016-10-18 MED ORDER — POTASSIUM CHLORIDE CRYS ER 20 MEQ PO TBCR: 20.0000 meq | EXTENDED_RELEASE_TABLET | Freq: Two times a day (BID) | ORAL | Status: DC

## 2016-10-18 MED ORDER — FUROSEMIDE 80 MG PO TABS: 80.0000 mg | ORAL_TABLET | Freq: Two times a day (BID) | ORAL | 1 refills | Status: DC

## 2016-10-18 MED ORDER — NITROGLYCERIN 0.4 MG SL SUBL: 0.4000 mg | SUBLINGUAL_TABLET | SUBLINGUAL | 1 refills | Status: DC | PRN

## 2016-10-18 MED ORDER — HYDRALAZINE HCL 10 MG PO TABS: 10.0000 mg | ORAL_TABLET | Freq: Three times a day (TID) | ORAL | Status: DC

## 2016-10-18 MED ORDER — CARVEDILOL 6.25 MG PO TABS: 6.2500 mg | ORAL_TABLET | Freq: Two times a day (BID) | ORAL | 3 refills | Status: DC

## 2016-10-18 MED ORDER — POTASSIUM CHLORIDE CRYS ER 20 MEQ PO TBCR: 20.0000 meq | EXTENDED_RELEASE_TABLET | Freq: Two times a day (BID) | ORAL | 1 refills | Status: DC

## 2016-10-18 MED ORDER — FUROSEMIDE 80 MG PO TABS: 80.0000 mg | ORAL_TABLET | Freq: Two times a day (BID) | ORAL | Status: DC

## 2016-10-18 MED ORDER — POTASSIUM CHLORIDE CRYS ER 10 MEQ PO TBCR: 30.0000 meq | EXTENDED_RELEASE_TABLET | Freq: Once | ORAL | Status: DC

## 2016-10-18 NOTE — Telephone Encounter (Signed)
New message    TOC appt made per Angie for 10/24/16 at 930am with Robbie Lis.

## 2016-10-18 NOTE — Progress Notes (Signed)
DC summary faxed as requested. Abelino Derrick [facility] [phone]

## 2016-10-18 NOTE — Progress Notes (Signed)
Physical Therapy Treatment Patient Details Name: Brittney Lopez MRN: [id number] DOB: [birth year] Today's Date: 10/18/2016    History of Present Illness 83 y.o. female who presents w/ a past medical history significant for CHF, CKD, CAD/CABG, DM, HLD, HTN, obesity, and Pulmonary HTN. Pt admitted on 10/10/16 for decompensated CHF and cellulitis in RLE.    PT Comments    Patient tolerated session well and is much more steady with use of RW for ambulation. Supervision overall. Pt will be safest with 24 hour assist/supervision at home due to cognitive/balance deficits. Not sure about the amount of assist available as pt is poor historian.   Follow Up Recommendations  Home health PT;supervision/assistance-24 hour     Equipment Recommendations  None recommended by PT    Recommendations for Other Services       Precautions / Restrictions Precautions Precautions: Fall Restrictions Weight Bearing Restrictions: No    Mobility  Bed Mobility               General bed mobility comments: Patient in chair.  Transfers Overall transfer level: Modified independent Equipment used: None             General transfer comment: Increased time  Ambulation/Gait Ambulation/Gait assistance: Supervision Ambulation Distance (Feet): 200 Feet Assistive device: Rolling walker (2 wheeled) Gait Pattern/deviations: Step-through pattern Gait velocity: decreased   General Gait Details: one standing rest break; pt much more steady with use of RW; cues for safe use of AD   Stairs            Wheelchair Mobility    Modified Rankin (Stroke Patients Only)       Balance Overall balance assessment: Needs assistance Sitting-balance support: No upper extremity supported;Feet supported Sitting balance-Leahy Scale: Good     Standing balance support: No upper extremity supported;During functional activity Standing balance-Leahy Scale: Fair Standing balance comment: static standing                              Cognition Arousal/Alertness: Awake/alert Behavior During Therapy: WFL for tasks assessed/performed Overall Cognitive Status: History of cognitive impairments - at baseline                                        Exercises      General Comments General comments (skin integrity, edema, etc.): VSS      Pertinent Vitals/Pain Pain Assessment: Faces Faces Pain Scale: Hurts a little bit Pain Location: ankles Pain Descriptors / Indicators: Sore Pain Intervention(s): Monitored during session    Home Living                      Prior Function            PT Goals (current goals can now be found in the care plan section) Progress towards PT goals: Progressing toward goals    Frequency    Min 3X/week      PT Plan Current plan remains appropriate    Co-evaluation              AM-PAC PT "6 Clicks" Daily Activity  Outcome Measure  Difficulty turning over in bed (including adjusting bedclothes, sheets and blankets)?: None Difficulty moving from lying on back to sitting on the side of the bed? : None Difficulty sitting down on and standing up from a chair  with arms (e.g., wheelchair, bedside commode, etc,.)?: None Help needed moving to and from a bed to chair (including a wheelchair)?: A Little Help needed walking in hospital room?: A Little Help needed climbing 3-5 steps with a railing? : A Little 6 Click Score: 21    End of Session Equipment Utilized During Treatment: Gait belt Activity Tolerance: Patient tolerated treatment well Patient left: in chair;with call bell/phone within reach;with chair alarm set Nurse Communication: Mobility status PT Visit Diagnosis: Unsteadiness on feet (R26.81);Other abnormalities of gait and mobility (R26.89);Muscle weakness (generalized) (M62.81);Ataxic gait (R26.0)     Time: 9977-4142 PT Time Calculation (min) (ACUTE ONLY): 16 min  Charges:  $Gait Training: 8-22  mins                    G Codes:       Erline Levine, PTA Pager: [phone]     Carolynne Edouard 10/18/2016, 12:01 PM

## 2016-10-18 NOTE — Progress Notes (Signed)
Patient with no complaints or concerns during 7pm - 7am shift.  Kierre Hintz, RN 

## 2016-10-18 NOTE — Progress Notes (Signed)
Pt is alert and confused with stable low vitals, plan to have hospice Care Come out to the home.

## 2016-10-18 NOTE — Discharge Summary (Signed)
Discharge Summary    Patient ID: Brittney Lopez,  MRN: [id number], DOB/AGE: 1929-01-29 83 y.o.  Admit date: 10/10/2016 Discharge date: 10/18/2016   Primary Care Provider: Merri Brunette Primary Cardiologist: Dr. Mayford Knife  Discharge Diagnoses    Principal Problem:   Acute on chronic diastolic CHF (congestive heart failure) (HCC) Active Problems:   Essential hypertension, benign   Coronary atherosclerosis of native coronary artery   Pure hypercholesterolemia   Pulmonary HTN (HCC)   Cellulitis   Swelling of right lower extremity   Diabetes with skin complication (HCC)   Intertrigo   AKI (acute kidney injury) (HCC)   Goals of care, counseling/discussion   Palliative care by specialist   Allergies No Known Allergies   History of Present Illness     Brittney Lopez a 83 y.o.femalewith a PMH significant for ASCAD s/p remote CABG, HTN, dyslipidemia, chronic diastolic CHF, CKD stage III, LE edema and HLD. She presented to [facility] following a clinic visit with Brittney Lopez, PAC. In the clinic, the pt continued to have increasing lower extremity edema despite increased lasix regimen for the past three days (lasix 40 mg PO). She also continued to have dyspnea. She denies orthopnea, PND, or syncope. She is compliant with a low sodium diet.  She is known to our service and last saw Dr. Mayford Knife on 10/06/16. She presented with RLE erythema and swelling consistent with cellulitis. She was placed on ABX and sent to the [facility] for further evaluation, including LE dopplers to rule out DVT. She reported to clinic with Brittney Lopez on 10/10/16 and revealed no improvement in her RLE cellulitis. She was admitted for IV ABX. She also had a BNP > 4000 and she failed outpatient diuretic therapy. She will also receive IV diuresis inpatient. Upon arrival to ED, troponin was found to be elevated to 0.11. She was directly admitted to cardiology. She also had an elevated D-dimer, no evidence of DVT on lower extremity  dopplers.    Hospital Course     Consultants: Family Medicine service for management of cellulitis.  Upon admission, Family Medicine teaching service was consulted for management of her lower extremity cellulitis and IV ABX. She was placed on IV clindamycin.   Repeat echo performed with LVEF 25% (which is newly reduced from 2015 echo) with elevated EDP, elevated left atrial filling pressure, and moderate tricuspid valve regurgitation.  She was diuresed with IV lasix and was overall net negative 8.3L on discharge. Her discharge weight was 197 lbs, down from 218 lbs on admission. She has a history of CAD. It was ultimately decided to treat her CAD, chest pain, and new onset systolic heart failure medically. Palliative care was consulted and invasive ischemic evaluation was not completed.   Acute on chronic kidney injury stage III Discharge sCr was 1.22, baseline appears to be near 1.06.  She was not started on an ACEI in the setting of abnormal kidney function. She was discharged on 80 mg lasix BID, 20 mEq of potassium, coreg 6.25 mg BID, and hydralazine 10 mg TID.   Patient seen and examined by Dr. Mayford Knife  today and was stable for discharge. All follow up has been arranged.  _____________  Discharge Vitals Blood pressure (!) 101/43, pulse 73, temperature 97.6 F (36.4 C), temperature source Tympanic, resp. rate 18, height 5\' 1"  (1.549 m), weight 197 lb 8 oz (89.6 kg), SpO2 98 %.  Filed Weights   10/16/16 0536 10/17/16 0619 10/18/16 0621  Weight: 202 lb 4.8  oz (91.8 kg) 201 lb 1.6 oz (91.2 kg) 197 lb 8 oz (89.6 kg)    Labs & Radiologic Studies    CBC No results for input(s): WBC, NEUTROABS, HGB, HCT, MCV, PLT in the last 72 hours. Basic Metabolic Panel  Recent Labs  10/16/16 0230 10/18/16 0304  NA 139 140  K 4.7 3.3*  CL 96* 99*  CO2 31 29  GLUCOSE 147* 134*  BUN 25* 25*  CREATININE 1.28* 1.22*  CALCIUM 8.5* 8.1*   Liver Function Tests No results for input(s): AST,  ALT, ALKPHOS, BILITOT, PROT, ALBUMIN in the last 72 hours. No results for input(s): LIPASE, AMYLASE in the last 72 hours. Cardiac Enzymes No results for input(s): CKTOTAL, CKMB, CKMBINDEX, TROPONINI in the last 72 hours. BNP Invalid input(s): POCBNP D-Dimer No results for input(s): DDIMER in the last 72 hours. Hemoglobin A1C No results for input(s): HGBA1C in the last 72 hours. Fasting Lipid Panel No results for input(s): CHOL, HDL, LDLCALC, TRIG, CHOLHDL, LDLDIRECT in the last 72 hours. Thyroid Function Tests No results for input(s): TSH, T4TOTAL, T3FREE, THYROIDAB in the last 72 hours.  Invalid input(s): FREET3 _____________  Dg Chest 2 View  Result Date: 10/06/2016 CLINICAL DATA:  Bilateral lower extremity edema for a few weeks, worsening. Onset of shortness of breath and nausea this week. EXAM: CHEST  2 VIEW COMPARISON:  PA and lateral chest 12/23/2003. FINDINGS: There is marked cardiomegaly without edema. No consolidative process, pneumothorax or effusion. Small area of linear scar in the lingula is unchanged. The patient is status post CABG. Atherosclerosis noted. No acute bony abnormality. IMPRESSION: Cardiomegaly without edema.  No acute disease. Atherosclerosis. Electronically Signed   By: Drusilla Kanner M.D.   On: 10/06/2016 18:13     Diagnostic Studies/Procedures    2D echo 10/11/16: Study Conclusions - Left ventricle: The cavity size was moderately dilated. Wall thickness was normal. The estimated ejection fraction was 25%. Diffuse hypokinesis. Doppler parameters are consistent with both elevated ventricular end-diastolic filling pressure and elevated left atrial filling pressure. - Mitral valve: Calcified annulus. There was mild regurgitation. - Left atrium: The atrium was moderately dilated. - Atrial septum: No defect or patent foramen ovale was identified. - Tricuspid valve: There was moderate regurgitation.    Disposition   Pt is being discharged home  today in good condition.  Follow-up Plans & Appointments    Follow-up Information    [facility] Follow [facility].   Specialty:  Hospice Services Why:  your home care agency Contact information: [address] [place] [address] [phone]        [facility] Follow up on 10/20/2016.   Why:  11:30 in hypertension clinic, with BMP Contact information: [address] [place] [address]       Robbie Lis M, PA-C Follow up on 10/24/2016.   Specialties:  Cardiology, Radiology Why:  9:30 AM for TOC and review repeat BMP Contact information: [address] [place] [address] [phone]          Discharge Instructions    Diet - low sodium heart healthy    Complete by:  As directed    Increase activity slowly    Complete by:  As directed       Discharge Medications   Current Discharge Medication List    START taking these medications   Details  carvedilol (COREG) 6.25 MG tablet Take 1 tablet (6.25 mg total) by mouth 2 (two) times daily  with a meal. Qty: 60 tablet, Refills: 3    hydrALAZINE (APRESOLINE) 10 MG tablet Take 1 tablet (10 mg total) by mouth every 8 (eight) hours. Qty: 90 tablet, Refills: 3    nitroGLYCERIN (NITROSTAT) 0.4 MG SL tablet Place 1 tablet (0.4 mg total) under the tongue every 5 (five) minutes x 3 doses as needed for chest pain. Qty: 25 tablet, Refills: 1    potassium chloride SA (K-DUR,KLOR-CON) 20 MEQ tablet Take 1 tablet (20 mEq total) by mouth 2 (two) times daily. Qty: 60 tablet, Refills: 1      CONTINUE these medications which have CHANGED   Details  furosemide (LASIX) 80 MG tablet Take 1 tablet (80 mg total) by mouth 2 (two) times daily. Qty: 60 tablet, Refills: 1      CONTINUE these medications which have NOT CHANGED   Details  acetaminophen (TYLENOL) 500 MG tablet Take 1,000 mg by mouth every 6 (six) hours as needed for mild pain.    aspirin 81 MG tablet  Take 81 mg by mouth daily.    metFORMIN (GLUCOPHAGE) 500 MG tablet Take 500 mg by mouth 2 (two) times daily with a meal.    Multiple Vitamin (MULTIVITAMIN) tablet Take 1 tablet by mouth daily.    rosuvastatin (CRESTOR) 10 MG tablet Take 10 mg by mouth daily.    saxagliptin HCl (ONGLYZA) 5 MG TABS tablet Take 5 mg by mouth daily.      STOP taking these medications     cephALEXin (KEFLEX) 500 MG capsule      lisinopril (PRINIVIL,ZESTRIL) 20 MG tablet      metoprolol succinate (TOPROL-XL) 100 MG 24 hr tablet      sulfamethoxazole-trimethoprim (BACTRIM DS,SEPTRA DS) 800-160 MG tablet           Outstanding Labs/Studies   She will follow up in HTN clinic Friday 10/20/16 with a repeat BMP. Please adjust potassium regimen according to BMP results at that visit.  TCM appt with Boyce Medici on 10/24/16. May need to adjust dose of lasix at that time.  Duration of Discharge Encounter   Greater than 30 minutes including physician time.  Signed, Roe Rutherford Dontea Corlew PA-C 10/18/2016, 1:34 PM

## 2016-10-18 NOTE — Progress Notes (Addendum)
Progress Note  Patient Name: Brittney Lopez Date of Encounter: 10/18/2016  Primary Cardiologist: Dr. Mayford Knife  Subjective   She is feeling well.  Wants to go home.  Denies any chest pain or SOB  Inpatient Medications    Scheduled Meds: . aspirin EC  81 mg Oral Daily  . carvedilol  6.25 mg Oral BID WC  . furosemide  80 mg Intravenous Q12H  . heparin  5,000 Units Subcutaneous Q8H  . insulin aspart  0-5 Units Subcutaneous QHS  . insulin aspart  0-9 Units Subcutaneous TID WC  . lactobacillus acidophilus  2 tablet Oral TID  . linagliptin  5 mg Oral Daily  . multivitamin with minerals  1 tablet Oral Daily  . nystatin ointment   Topical BID  . rosuvastatin  10 mg Oral Daily  . saccharomyces boulardii  250 mg Oral BID   Continuous Infusions:  PRN Meds: acetaminophen, nitroGLYCERIN, ondansetron (ZOFRAN) IV   Vital Signs    Vitals:   10/17/16 2104 10/17/16 2233 10/18/16 0621 10/18/16 0700  BP: 140/72 (!) 125/53 132/70   Pulse: 78 85 80   Resp: 17  18   Temp: 98 F (36.7 C)  98 F (36.7 C)   TempSrc: Oral  Oral   SpO2: 97% 95% 96% 96%  Weight:   197 lb 8 oz (89.6 kg)   Height:        Intake/Output Summary (Last 24 hours) at 10/18/16 0926 Last data filed at 10/18/16 1610  Gross per 24 hour  Intake              580 ml  Output             1850 ml  Net            -1270 ml   Filed Weights   10/16/16 0536 10/17/16 0619 10/18/16 0621  Weight: 202 lb 4.8 oz (91.8 kg) 201 lb 1.6 oz (91.2 kg) 197 lb 8 oz (89.6 kg)    Telemetry    NSR - Personally Reviewed  ECG    No new EKG to review- Personally Reviewed  Physical Exam   GEN: WD, WN in NAD Neck: No JVD Cardiac: RRR with no M/R/G Respiratory: CTA bilaterally GI: soft, NT, ND with active BS MS: trace edema in RLE.  Erhythema resolved Neuro:nonfocal Psych: normal affect  Labs    Chemistry  Recent Labs Lab 10/15/16 0229 10/16/16 0230 10/18/16 0304  NA 138 139 140  K 3.5 4.7 3.3*  CL 95* 96* 99*    CO2 32 31 29  GLUCOSE 142* 147* 134*  BUN 29* 25* 25*  CREATININE 1.54* 1.28* 1.22*  CALCIUM 8.4* 8.5* 8.1*  GFRNONAA 30* 37* 39*  GFRAA 34* 43* 45*  ANIONGAP 11 12 12      Hematology  Recent Labs Lab 10/15/16 0229  WBC 8.9  RBC 4.34  HGB 13.0  HCT 40.7  MCV 93.8  MCH 30.0  MCHC 31.9  RDW 14.7  PLT 254    Cardiac EnzymesNo results for input(s): TROPONINI in the last 168 hours. No results for input(s): TROPIPOC in the last 168 hours.   BNP No results for input(s): BNP, PROBNP in the last 168 hours.   DDimer No results for input(s): DDIMER in the last 168 hours.   Radiology    No results found.  Cardiac Studies  2D echo Study Conclusions  - Left ventricle: The cavity size was moderately dilated. Wall   thickness was normal.  The estimated ejection fraction was 25%.   Diffuse hypokinesis. Doppler parameters are consistent with both   elevated ventricular end-diastolic filling pressure and elevated   left atrial filling pressure. - Mitral valve: Calcified annulus. There was mild regurgitation. - Left atrium: The atrium was moderately dilated. - Atrial septum: No defect or patent foramen ovale was identified. - Tricuspid valve: There was moderate regurgitation.   Patient Profile     83 y.o. female  Hx of CAD (s/p CABG), HTN, HL CKD stage 3 admited with CHF exacerbation    Assessment & Plan    1  Acute on chronic systolic / diastolic CHF  LVEF 25%    - she is negative 1.8L yesterday and net neg 8.3L from admit.   - weight down 21lbs from admit (218>>197).    - continue Carvedilol 6.25mg  BID  - No ACE I secondary to CKD .  Creatinine improving with diuresis - now 1.22. - Change to PO lasix 80mg  BID - check BMET and BP check in HTN clinic on Friday in office - BP stable so will add Hydralazine 10mg  TID.  2  CAD  S/p CABG   - Dr. Tenny Craw spoke with patient's daughter - apparently patient does not remember having conversation with Dr. Duke Salvia in regareds to  possible cath - patient has dementia and without any CP and with limited insight into her medical issues as well as advanced age - it was decided to try maximizing medical therapy and avoid invasive evaluation.  - Pt needs to have help once leaves the hosp to assure meds taken, keeps on correct diet, fluid restriction - Case management on board - continue ASA, BB, statin  3  Right leg cellulitis and swelling - much improved with erythma resolving and only trace edema. - s/p completion of Clindamycin Rx per IM.   - RLE venous duplex negative.  The patient is doing well and tolerating her meds.  She wants to go home.  Will discharge home today.  Check BMET on Friday as well as BP check in HTN clinic and early TOC followup next week.  She was instructed to weight daily in the am and call if weight increases by 3lbs in a day or 5lbs in a week.    Signed, Armanda Magic, MD  10/18/2016, 9:26 AM

## 2016-10-18 NOTE — Progress Notes (Signed)
Gave Discharge instruction to son and patient with stable vitals IV out and signed papers.

## 2016-10-19 NOTE — Telephone Encounter (Signed)
Patient daughter, Vernona Rieger ([place]), contacted regarding discharge from [facility] on 10/18/2016 Daughter understands to follow up with provider Boyce Medici on 10/24/2016 @ 0930 at [facility]. office Daughter understands discharge instructions? yes Daughter understands medications and regiment? Yes, per daughter, Pt's son lives nearby Pt and has filled all prescriptions.  Per daughter they have someone come to Pt's house twice a day to give medications to Pt. Daughter understands to bring all medications to this visit? yes Daughter states things are going well so far s/p discharge.  Family has elected hospice care for Pt, visit scheduled for tomorrow 10/20/2016.  Daughter states she cannot make appt on Friday for HTN clinic.  Spoke with pharmacist, OK to cancel appt d/t Pt to be seen next Tuesday.  Notified daughter appt cancelled.  Daughter appreciative d/t Pt with multiple f/u appts and Pt with dementia so it is difficult to take her to appts.  Notified daughter to call this nurse if any further needs.

## 2016-10-20 ENCOUNTER — Ambulatory Visit: Payer: Medicare Other

## 2016-10-24 ENCOUNTER — Encounter: Payer: Self-pay | Admitting: Cardiology

## 2016-10-24 ENCOUNTER — Ambulatory Visit (INDEPENDENT_AMBULATORY_CARE_PROVIDER_SITE_OTHER): Payer: Medicare Other | Admitting: Cardiology

## 2016-10-24 VITALS — BP 110/60 | HR 96 | Ht 60.0 in | Wt 191.0 lb

## 2016-10-24 DIAGNOSIS — N289 Disorder of kidney and ureter, unspecified: Secondary | ICD-10-CM | POA: Diagnosis not present

## 2016-10-24 DIAGNOSIS — I5022 Chronic systolic (congestive) heart failure: Secondary | ICD-10-CM | POA: Diagnosis not present

## 2016-10-24 LAB — BASIC METABOLIC PANEL
BUN/Creatinine Ratio: 28 (ref 12–28)
BUN: 48 mg/dL — ABNORMAL HIGH (ref 8–27)
CALCIUM: 10.7 mg/dL — AB (ref 8.7–10.3)
CO2: 26 mmol/L (ref 20–29)
Chloride: 92 mmol/L — ABNORMAL LOW (ref 96–106)
Creatinine, Ser: 1.73 mg/dL — ABNORMAL HIGH (ref 0.57–1.00)
GFR calc Af Amer: 31 mL/min/{1.73_m2} — ABNORMAL LOW (ref >59)
GFR calc non Af Amer: 27 mL/min/{1.73_m2} — ABNORMAL LOW (ref >59)
GLUCOSE: 191 mg/dL — AB (ref 65–99)
Potassium: 5 mmol/L (ref 3.5–5.2)
SODIUM: 139 mmol/L (ref 134–144)

## 2016-10-24 MED ORDER — HYDRALAZINE HCL 10 MG PO TABS: 10.0000 mg | ORAL_TABLET | Freq: Two times a day (BID) | ORAL | 3 refills | Status: DC

## 2016-10-24 NOTE — Patient Instructions (Signed)
Medication Instructions:  1) DECREASE Hydralazine to 10mg  twice daily.  Monitor blood pressures and let us know if blood pressure is consistently higher than 130/80.  Labwork: BMET today  Testing/Procedures: None  Follow-Up: Your physician recommends that you schedule a follow-up appointment in: 3 months with Dr. Mayford Knife.    Any Other Special Instructions Will Be Listed Below (If Applicable).  Your provider would like for you to weigh daily.  This should be done at about the same time everyday, in the same amount of clothing.  If you gain 3 pounds overnight or 5 pounds in a week, please contact our office.    Low-Sodium Eating Plan Sodium, which is an element that makes up salt, helps you maintain a healthy balance of fluids in your body. Too much sodium can increase your blood pressure and cause fluid and waste to be held in your body. Your health care provider or dietitian may recommend following this plan if you have high blood pressure (hypertension), kidney disease, liver disease, or heart failure. Eating less sodium can help lower your blood pressure, reduce swelling, and protect your heart, liver, and kidneys. What are tips for following this plan? General guidelines  Most people on this plan should limit their sodium intake to 1,500-2,000 mg (milligrams) of sodium each day. Reading food labels  The Nutrition Facts label lists the amount of sodium in one serving of the food. If you eat more than one serving, you must multiply the listed amount of sodium by the number of servings.  Choose foods with less than 140 mg of sodium per serving.  Avoid foods with 300 mg of sodium or more per serving. Shopping  Look for lower-sodium products, often labeled as "low-sodium" or "no salt added."  Always check the sodium content even if foods are labeled as "unsalted" or "no salt added".  Buy fresh foods. ? Avoid canned foods and premade or frozen meals. ? Avoid canned, cured, or  processed meats  Buy breads that have less than 80 mg of sodium per slice. Cooking  Eat more home-cooked food and less restaurant, buffet, and fast food.  Avoid adding salt when cooking. Use salt-free seasonings or herbs instead of table salt or sea salt. Check with your health care provider or pharmacist before using salt substitutes.  Cook with plant-based oils, such as canola, sunflower, or olive oil. Meal planning  When eating at a restaurant, ask that your food be prepared with less salt or no salt, if possible.  Avoid foods that contain MSG (monosodium glutamate). MSG is sometimes added to [place] food, bouillon, and some canned foods. What foods are recommended? The items listed may not be a complete list. Talk with your dietitian about what dietary choices are best for you. Grains Low-sodium cereals, including oats, puffed wheat and rice, and shredded wheat. Low-sodium crackers. Unsalted rice. Unsalted pasta. Low-sodium bread. Whole-grain breads and whole-grain pasta. Vegetables Fresh or frozen vegetables. "No salt added" canned vegetables. "No salt added" tomato sauce and paste. Low-sodium or reduced-sodium tomato and vegetable juice. Fruits Fresh, frozen, or canned fruit. Fruit juice. Meats and other protein foods Fresh or frozen (no salt added) meat, poultry, seafood, and fish. Low-sodium canned tuna and salmon. Unsalted nuts. Dried peas, beans, and lentils without added salt. Unsalted canned beans. Eggs. Unsalted nut butters. Dairy Milk. Soy milk. Cheese that is naturally low in sodium, such as ricotta cheese, fresh mozzarella, or Swiss cheese Low-sodium or reduced-sodium cheese. Cream cheese. Yogurt. Fats and oils Unsalted butter.  Unsalted margarine with no trans fat. Vegetable oils such as canola or olive oils. Seasonings and other foods Fresh and dried herbs and spices. Salt-free seasonings. Low-sodium mustard and ketchup. Sodium-free salad dressing. Sodium-free light  mayonnaise. Fresh or refrigerated horseradish. Lemon juice. Vinegar. Homemade, reduced-sodium, or low-sodium soups. Unsalted popcorn and pretzels. Low-salt or salt-free chips. What foods are not recommended? The items listed may not be a complete list. Talk with your dietitian about what dietary choices are best for you. Grains Instant hot cereals. Bread stuffing, pancake, and biscuit mixes. Croutons. Seasoned rice or pasta mixes. Noodle soup cups. Boxed or frozen macaroni and cheese. Regular salted crackers. Self-rising flour. Vegetables Sauerkraut, pickled vegetables, and relishes. Olives. [place] fries. Onion rings. Regular canned vegetables (not low-sodium or reduced-sodium). Regular canned tomato sauce and paste (not low-sodium or reduced-sodium). Regular tomato and vegetable juice (not low-sodium or reduced-sodium). Frozen vegetables in sauces. Meats and other protein foods Meat or fish that is salted, canned, smoked, spiced, or pickled. Bacon, ham, sausage, hotdogs, corned beef, chipped beef, packaged lunch meats, salt pork, jerky, pickled herring, anchovies, regular canned tuna, sardines, salted nuts. Dairy Processed cheese and cheese spreads. Cheese curds. Blue cheese. Feta cheese. String cheese. Regular cottage cheese. Buttermilk. Canned milk. Fats and oils Salted butter. Regular margarine. Ghee. Bacon fat. Seasonings and other foods Onion salt, garlic salt, seasoned salt, table salt, and sea salt. Canned and packaged gravies. Worcestershire sauce. Tartar sauce. Barbecue sauce. Teriyaki sauce. Soy sauce, including reduced-sodium. Steak sauce. Fish sauce. Oyster sauce. Cocktail sauce. Horseradish that you find on the shelf. Regular ketchup and mustard. Meat flavorings and tenderizers. Bouillon cubes. Hot sauce and Tabasco sauce. Premade or packaged marinades. Premade or packaged taco seasonings. Relishes. Regular salad dressings. Salsa. Potato and tortilla chips. Corn chips and puffs. Salted  popcorn and pretzels. Canned or dried soups. Pizza. Frozen entrees and pot pies. Summary  Eating less sodium can help lower your blood pressure, reduce swelling, and protect your heart, liver, and kidneys.  Most people on this plan should limit their sodium intake to 1,500-2,000 mg (milligrams) of sodium each day.  Canned, boxed, and frozen foods are high in sodium. Restaurant foods, fast foods, and pizza are also very high in sodium. You also get sodium by adding salt to food.  Try to cook at home, eat more fresh fruits and vegetables, and eat less fast food, canned, processed, or prepared foods. This information is not intended to replace advice given to you by your health care provider. Make sure you discuss any questions you have with your health care provider. Document Released: 10/14/2001 Document Revised: 04/17/2016 Document Reviewed: 04/17/2016 Elsevier Interactive Patient Education  2017 [employer].    If you need a refill on your cardiac medications before your next appointment, please call your pharmacy.

## 2016-10-24 NOTE — Progress Notes (Signed)
10/24/2016 KIANNAH MENTH   05-31-1928  [id number]  Primary Physician Merri Brunette, MD Primary Cardiologist: Dr. Mayford Knife    Reason for Visit/CC: [facility] f/u for Acute on Chronic Systolic HF  HPI:  Brittney Lopez is a 83 y.o. female who presents to clinic today for [facility] post hospital f/u.  PMH is significant for ASCAD s/p remote CABG, HTN, dyslipidemia, chronic diastolic CHF, CKD stage III, LE edema and HLD. She presented to [facility] following a clinic visit with Chelsea Aus, PAC. In the clinic, the pt continued to have increasing lower extremity edema despite increased lasix regimen for the past three days (lasix 40 mg PO). She also continued to have dyspnea. She denied orthopnea, PND, or syncope. She had been compliant with a low sodium diet. BNP was markedly elevated at > 4000 and troponin was found to be elevated to 0.11. She was directly admitted to cardiology. Placed on IV diuretics for acute CHF and antibiotics for cellulitis. Her cellulitis was managed by IM. LE dopplers were negative for DVT.    Repeat echo was performed which showed LVEF of 25% (which is newly reduced from 2015 echo) with elevated EDP, elevated left atrial filling pressure, and moderate tricuspid valve regurgitation.  She was diuresed with IV lasix and was overall net negative 8.3L on discharge. Her discharge weight was 197 lbs, down from 218 lbs on admission. She has a history of CAD. It was ultimately decided to treat her CAD, chest pain, and new onset systolic heart failure medically. Palliative care was consulted and invasive ischemic evaluation was not completed. She was not started on an ACEI in the setting of abnormal kidney function. SCr had improved to 1.22 day of d/c (was 1.54). She was discharged on 80 mg lasix BID, 20 mEq of potassium, coreg 6.25 mg BID, and hydralazine 10 mg TID. She was discharged home on 10/18/16 and presents back today for f/u and for repeat BMP.   She has had issues with short term  memory. She has a family friend who assist her with her medications. Her friend is with her today. She denies dyspnea, orthopnea/PND. No LEE. Her cellulitis has resolved. No fever or chills. She also denies CP. She feels tired all the time but denies dizziness, syncope/ near syncope. She has been compliant with meds but has not been checking weights at home. She is having a hard time remembering to take hydralazine TID.   Her office weight today is 191 lb. BP is 110/60.    Current Meds  Medication Sig  . acetaminophen (TYLENOL) 500 MG tablet Take 1,000 mg by mouth every 6 (six) hours as needed for mild pain.  Marland Kitchen aspirin 81 MG tablet Take 81 mg by mouth daily.  . carvedilol (COREG) 6.25 MG tablet Take 1 tablet (6.25 mg total) by mouth 2 (two) times daily with a meal.  . furosemide (LASIX) 80 MG tablet Take 1 tablet (80 mg total) by mouth 2 (two) times daily.  . hydrALAZINE (APRESOLINE) 10 MG tablet Take 1 tablet (10 mg total) by mouth every 8 (eight) hours.  . metFORMIN (GLUCOPHAGE) 500 MG tablet Take 500 mg by mouth 2 (two) times daily with a meal.  . Multiple Vitamin (MULTIVITAMIN) tablet Take 1 tablet by mouth daily.  . nitroGLYCERIN (NITROSTAT) 0.4 MG SL tablet Place 1 tablet (0.4 mg total) under the tongue every 5 (five) minutes x 3 doses as needed for chest pain.  . potassium chloride SA (K-DUR,KLOR-CON) 20 MEQ tablet Take  1 tablet (20 mEq total) by mouth 2 (two) times daily.  . rosuvastatin (CRESTOR) 10 MG tablet Take 10 mg by mouth daily.  . saxagliptin HCl (ONGLYZA) 5 MG TABS tablet Take 5 mg by mouth daily.   No Known Allergies Past Medical History:  Diagnosis Date  . Chronic diastolic CHF (congestive heart failure) (HCC)    grade II diastolic dysfunction on echo 08/2012   . CKD (chronic kidney disease), stage III   . Coronary artery disease    s/p CABG  . DM (diabetes mellitus) type I controlled with eye manifestation (HCC)    diabetic proliferative retinopathy  . Dyslipidemia     . Hypertension   . Ischemic cardiomyopathy    Resolved with EF 60% by Echo 2007  . Memory loss    MIld,MMSE 12/19/11 difficulty clock drawing,normal animal fluency  . Obesity   . Pulmonary HTN (HCC)    mild with PASP 40-78mmHg echo 08/2012 and resolved by echo 2015   Family History  Problem Relation Age of Onset  . Other Unknown        PT STATES FM HAS NO HEALTH ISSUES   Past Surgical History:  Procedure Laterality Date  . CORONARY ARTERY BYPASS GRAFT     Social History   Social History  . Marital status: Divorced    Spouse name: N/A  . Number of children: 4  . Years of education: 12   Occupational History  . retired    Social History Main Topics  . Smoking status: Never Smoker  . Smokeless tobacco: Never Used  . Alcohol use No  . Drug use: No  . Sexual activity: Not on file   Other Topics Concern  . Not on file   Social History Narrative  . No narrative on file     Review of Systems: General: negative for chills, fever, night sweats or weight changes.  Cardiovascular: negative for chest pain, dyspnea on exertion, edema, orthopnea, palpitations, paroxysmal nocturnal dyspnea or shortness of breath Dermatological: negative for rash Respiratory: negative for cough or wheezing Urologic: negative for hematuria Abdominal: negative for nausea, vomiting, diarrhea, bright red blood per rectum, melena, or hematemesis Neurologic: negative for visual changes, syncope, or dizziness All other systems reviewed and are otherwise negative except as noted above.   Physical Exam:  Height 5\' 1"  (1.549 m), weight 191 lb (86.6 kg).  General appearance: alert, cooperative and no distress Neck: no carotid bruit and no JVD Lungs: clear to auscultation bilaterally Heart: regular rate and rhythm, S1, S2 normal, no murmur, click, rub or gallop Extremities: extremities normal, atraumatic, no cyanosis or edema Pulses: 2+ and symmetric Skin: Skin color, texture, turgor normal. No  rashes or lesions Neurologic: Grossly normal  EKG no performed -- personally reviewed   ASSESSMENT AND PLAN:   1. Systolic HF: acute exacerbation treated in hospital. Improved. Euvolemic on exam. No recurrent dyspnea, LEE, orthopnea or PND.  EF 25% (which is newly reduced from 2015 echo). She has a h/o CAD, however it was decided not to pursue invasive therapies. Plan is for palliative care. Continue medical therapy w/ BB. No ACE/ARB given renal insuffiencey. Continue hydralazine for after load reduction. Continue low sodium diet. Pt instructed to monitor weights, continue daily lasix and to call our office if > 3lb weight gain in 24 hrs of >5lbs in 1 week. Given her soft BP and difficulties remembering to take hydralazine TID, we will try BID dosing. Pt instructed to monitor BP at home. If spikes  in BP, we can further titrate her hydralazine dose. She and her caregiver will monitor this. We will also check a repeat BMP today to monitor renal function and K.   2. CAD: She has a history of CAD w/ prior CABG. It was ultimately decided to treat her CAD, chest pain, and new onset systolic heart failure medically. Palliative care was consulted and invasive ischemic evaluation was not completed. She denies any CP. Continue medical therapy.   3. Cellulitis of LE: treated with antibiotics. Resolved. No fever or chills.   4. Renal insufficiency: Scr improved by day of discharge from 1.5>>1.2. Baseline ~1.06. We will check a f/u BMP today.    Follow-Up w/ Dr. Mayford Knife in 3 months  Trentan Trippe Delmer Islam, MHS [facility] 10/24/2016 9:50 AM

## 2016-10-26 ENCOUNTER — Telehealth: Payer: Self-pay | Admitting: *Deleted

## 2016-10-26 DIAGNOSIS — Z79899 Other long term (current) drug therapy: Secondary | ICD-10-CM

## 2016-10-26 MED ORDER — FUROSEMIDE 80 MG PO TABS: 80.0000 mg | ORAL_TABLET | Freq: Every day | ORAL | 3 refills | Status: AC

## 2016-10-26 MED ORDER — POTASSIUM CHLORIDE CRYS ER 20 MEQ PO TBCR: 20.0000 meq | EXTENDED_RELEASE_TABLET | Freq: Every day | ORAL | 3 refills | Status: DC

## 2016-10-26 NOTE — Telephone Encounter (Signed)
-----   Message from Allayne Butcher, [place] sent at 10/25/2016  4:03 PM EDT ----- Scr has increased to 1.7. Reduce Lasix down to 80 mg once daily. Repeat BMP in 1 week. She will need to monitor her weight daily. If she notices any weight gain of > 3 lb in 24 hrs or 5 lb in 1 week, call our office for notification. Reduce K-dur down to 20 mEq once daily.

## 2016-11-02 ENCOUNTER — Other Ambulatory Visit: Payer: Medicare Other

## 2017-02-26 ENCOUNTER — Telehealth: Payer: Self-pay | Admitting: Cardiology

## 2017-02-26 MED ORDER — CARVEDILOL 6.25 MG PO TABS: 6.2500 mg | ORAL_TABLET | Freq: Two times a day (BID) | ORAL | 2 refills | Status: DC

## 2017-02-26 NOTE — Telephone Encounter (Signed)
Pt's medication was sent to pt's pharmacy as requested. Confirmation received.  °

## 2017-02-26 NOTE — Telephone Encounter (Signed)
New message    *STAT* If patient is at the pharmacy, call can be transferred to refill team.   1. Which medications need to be refilled? (please list name of each medication and dose if known)  Coreg    Carvedilol 6.25mg  2x day  2. Which pharmacy/location (including street and city if local pharmacy) is medication to be sent to? [facility] on [address]  3. Do they need a 30 day or 90 day supply? 90 day

## 2017-11-25 ENCOUNTER — Other Ambulatory Visit: Payer: Self-pay | Admitting: Cardiology

## 2017-12-21 ENCOUNTER — Other Ambulatory Visit: Payer: Self-pay | Admitting: Cardiology

## 2018-01-14 ENCOUNTER — Other Ambulatory Visit: Payer: Self-pay | Admitting: Cardiology

## 2018-02-11 ENCOUNTER — Encounter: Payer: Self-pay | Admitting: Cardiology

## 2018-02-17 ENCOUNTER — Other Ambulatory Visit: Payer: Self-pay | Admitting: Cardiology

## 2018-02-25 ENCOUNTER — Ambulatory Visit: Payer: Medicare Other | Admitting: Cardiology

## 2019-07-11 ENCOUNTER — Other Ambulatory Visit: Payer: Self-pay | Admitting: Nephrology

## 2019-07-11 DIAGNOSIS — N1832 Chronic kidney disease, stage 3b: Secondary | ICD-10-CM

## 2019-07-24 ENCOUNTER — Ambulatory Visit
Admission: RE | Admit: 2019-07-24 | Discharge: 2019-07-24 | Disposition: A | Discharge status: Home / Self Care | Payer: Medicare Other | Source: Ambulatory Visit | Attending: Nephrology | Admitting: Nephrology

## 2019-07-24 DIAGNOSIS — N1832 Chronic kidney disease, stage 3b: Secondary | ICD-10-CM

## 2019-08-04 ENCOUNTER — Ambulatory Visit: Payer: Medicare Other | Admitting: Cardiology

## 2019-08-04 ENCOUNTER — Encounter: Payer: Self-pay | Admitting: Cardiology

## 2019-08-04 ENCOUNTER — Other Ambulatory Visit: Payer: Self-pay

## 2019-08-04 VITALS — BP 120/64 | HR 71 | Ht 60.0 in | Wt 201.2 lb

## 2019-08-04 DIAGNOSIS — I2510 Atherosclerotic heart disease of native coronary artery without angina pectoris: Secondary | ICD-10-CM

## 2019-08-04 DIAGNOSIS — I272 Pulmonary hypertension, unspecified: Secondary | ICD-10-CM

## 2019-08-04 DIAGNOSIS — I5022 Chronic systolic (congestive) heart failure: Secondary | ICD-10-CM

## 2019-08-04 DIAGNOSIS — I10 Essential (primary) hypertension: Secondary | ICD-10-CM

## 2019-08-04 NOTE — Patient Instructions (Signed)
Medication Instructions:  Your physician recommends that you continue on your current medications as directed. Please refer to the Current Medication list given to you today.  *If you need a refill on your cardiac medications before your next appointment, please call your pharmacy*  Follow-Up: At [facility], you and your health needs are our priority.  As part of our continuing mission to provide you with exceptional heart care, we have created designated Provider Care Teams.  These Care Teams include your primary Cardiologist (physician) and Advanced Practice Providers (APPs -  Physician Assistants and Nurse Practitioners) who all work together to provide you with the care you need, when you need it.  Your next appointment:   6 month(s)  The format for your next appointment:   In Person  Provider:   Traci Turner, MD   

## 2019-08-04 NOTE — Progress Notes (Signed)
Cardiology Office Note:    Date:  08/04/2019   ID:  Marjo Bicker, DOB 1928-08-21, MRN [id number]  PCP:  Carol Ada, MD  Cardiologist:  Fransico Him, MD    Referring MD: Carol Ada, MD   No chief complaint on file.   History of Present Illness:    Brittney Lopez is a 86 y.o. female with a hx of ASCAD s/p remote CABG, HTN, dyslipidemia, chronic diastolic CHF, CKD stage III, LE edema and HLD. She presented to [facility] following a clinic visit with Robbie Lis, PAC. In the clinic, the pt continued to have increasing lower extremity edema despite increased lasix regimen for the past three days (lasix 40 mg PO). She also continued to have dyspnea. She denied orthopnea, PND, or syncope. She had been compliant with a low sodium diet. BNP was markedly elevated at >4000 and troponin was found to be elevated to 0.11. She was directly admitted to cardiology. Placed on IV diuretics for acute CHF and antibiotics for cellulitis. Her cellulitis was managed by IM. LE dopplers were negative for DVT.    Repeat echo was performed which showed LVEF of 25% (which is newly reduced from 2015 echo) with elevated EDP, elevated left atrial filling pressure, and moderate tricuspid valve regurgitation. She was diuresed with IV lasix and was overall net negative 8.3L on discharge. Her discharge weight was 197 lbs, down from 218 lbs on admission. She has a history of CAD. It was ultimately decided to treat her CAD, chest pain, and new onset systolic heart failure medically. Palliative care was consulted and invasive ischemic evaluation was not completed. She was not started on an ACEI in the setting of abnormal kidney function. SCr had improved to 1.22 day of d/c (was 1.54). She was discharged on 80 mg lasix BID, 20 mEq of potassium, coreg 6.25 mg BID, and hydralazine 10 mg TID. She was discharged home on 10/18/16 and presents back today for f/u and for repeat BMP.   She is here today for followup and is doing  well.  She denies any chest pain or pressure, SOB, DOE, PND, orthopnea, LE edema, dizziness, palpitations or syncope. She is compliant with her meds and is tolerating meds with no SE.     Past Medical History:  Diagnosis Date  . Chronic diastolic CHF (congestive heart failure) (HCC)    grade II diastolic dysfunction on echo 08/2012   . CKD (chronic kidney disease), stage III   . Coronary artery disease    s/p CABG  . DM (diabetes mellitus) type I controlled with eye manifestation ([place])    diabetic proliferative retinopathy  . Dyslipidemia   . Hypertension   . Ischemic cardiomyopathy    Resolved with EF 60% by Echo 2007  . Memory loss    MIld,MMSE 12/19/11 difficulty clock drawing,normal animal fluency  . Obesity   . Pulmonary HTN ([place])    mild with PASP 40-59mmHg echo 08/2012 and resolved by echo 2015    Past Surgical History:  Procedure Laterality Date  . CORONARY ARTERY BYPASS GRAFT      Current Medications: Current Meds  Medication Sig  . aspirin 81 MG tablet Take 81 mg by mouth daily.  . carvedilol (COREG) 6.25 MG tablet Take 1 tablet (6.25 mg total) by mouth 2 (two) times daily with a meal. Make appt with Dr Radford Pax for future refills [phone] 1st attempt  . furosemide (LASIX) 80 MG tablet Take 1 tablet (80 mg total) by mouth daily.  Marland Kitchen  hydrALAZINE (APRESOLINE) 10 MG tablet Take 1 tablet (10 mg total) by mouth 2 (two) times daily. Make sure you make appt with Dr. Mayford Knife 2nd Attempt  . linagliptin (TRADJENTA) 5 MG TABS tablet Take 5 mg by mouth daily.   . metFORMIN (GLUCOPHAGE) 500 MG tablet Take 500 mg by mouth 2 (two) times daily with a meal.  . Multiple Vitamin (MULTIVITAMIN) tablet Take 1 tablet by mouth daily.  . Rosuvastatin Calcium 5 MG CPSP Take 5 mg by mouth daily.      Allergies:   Patient has no known allergies.   Social History   Socioeconomic History  . Marital status: Divorced    Spouse name: Not on file  . Number of children: 4  . Years of  education: 34  . Highest education level: Not on file  Occupational History  . Occupation: retired  Tobacco Use  . Smoking status: Never Smoker  . Smokeless tobacco: Never Used  Substance and Sexual Activity  . Alcohol use: No  . Drug use: No  . Sexual activity: Not on file  Other Topics Concern  . Not on file  Social History Narrative  . Not on file   Social Determinants of Health   Financial Resource Strain:   . Difficulty of Paying Living Expenses:   Food Insecurity:   . Worried About Programme researcher, broadcasting/film/video in the Last Year:   . Barista in the Last Year:   Transportation Needs:   . Freight forwarder (Medical):   Marland Kitchen Lack of Transportation (Non-Medical):   Physical Activity:   . Days of Exercise per Week:   . Minutes of Exercise per Session:   Stress:   . Feeling of Stress :   Social Connections:   . Frequency of Communication with Friends and Family:   . Frequency of Social Gatherings with Friends and Family:   . Attends Religious Services:   . Active Member of Clubs or Organizations:   . Attends Banker Meetings:   Marland Kitchen Marital Status:      Family History: The patient's family history includes Other in her unknown relative.  ROS:   Please see the history of present illness.    ROS  All other systems reviewed and negative.   EKGs/Labs/Other Studies Reviewed:    The following studies were reviewed today: EKG  EKG:  EKG is  ordered today.  The ekg ordered today demonstrates NSR with PVCs and low voltage QRS  Recent Labs: No results found for requested labs within last 8760 hours.   Recent Lipid Panel    Component Value Date/Time   CHOL 63 10/11/2016 0453   TRIG 95 10/11/2016 0453   HDL 31 (L) 10/11/2016 0453   CHOLHDL 2.0 10/11/2016 0453   VLDL 19 10/11/2016 0453   LDLCALC 13 10/11/2016 0453    Physical Exam:    VS:  BP 120/64   Pulse 71   Ht 5' (1.524 m)   Wt 201 lb 3.2 oz (91.3 kg)   BMI 39.29 kg/m     Wt Readings from  Last 3 Encounters:  08/04/19 201 lb 3.2 oz (91.3 kg)  10/24/16 191 lb (86.6 kg)  10/18/16 197 lb 8 oz (89.6 kg)     GEN:  Well nourished, well developed in no acute distress HEENT: Normal NECK: No JVD; No carotid bruits LYMPHATICS: No lymphadenopathy CARDIAC: RRR, no murmurs, rubs, gallops RESPIRATORY:  Clear to auscultation without rales, wheezing or rhonchi  ABDOMEN: Soft,  non-tender, non-distended MUSCULOSKELETAL:  No edema; No deformity  SKIN: Warm and dry NEUROLOGIC:  Alert and oriented x 3 PSYCHIATRIC:  Normal affect   ASSESSMENT:    1. Chronic systolic CHF (congestive heart failure) (HCC)   2. Atherosclerosis of native coronary artery of native heart without angina pectoris   3. Essential hypertension, benign   4. Pulmonary HTN (HCC)    PLAN:    In order of problems listed above:  1. Chronic systolic HF: - EF 25% (which is newly reduced from 2015 echo).  -She has a h/o CAD, however it was decided not to pursue invasive therapies  -Continue medical therapy Carvedilol 6.25mg  BID, Hydralazine 10mg  BID and Lasix 80mg  daily -no ACEI/ARB due to CKD  2. CAD:  -She has a history of CAD w/ prior CABG.  -she has been on medical therapy -denies any anginal sx -continue Carvedilol 6.25mg  BID, statin and ASA  4. Renal insufficiency:  - Baseline ~1.06.  -Creatinine last summer 1.52 -followed by PCP  5.  HLD -LDL goal < 70 -continue Crestor 5mg  daily -LDL was 16 in July 2021    Medication Adjustments/Labs and Tests Ordered: Current medicines are reviewed at length with the patient today.  Concerns regarding medicines are outlined above.  No orders of the defined types were placed in this encounter.  No orders of the defined types were placed in this encounter.   Signed, , MD  08/04/2019 11:25 AM    [facility]

## 2019-12-08 DIAGNOSIS — R262 Difficulty in walking, not elsewhere classified: Secondary | ICD-10-CM | POA: Diagnosis not present

## 2019-12-08 DIAGNOSIS — E1121 Type 2 diabetes mellitus with diabetic nephropathy: Secondary | ICD-10-CM | POA: Diagnosis not present

## 2019-12-08 DIAGNOSIS — Z741 Need for assistance with personal care: Secondary | ICD-10-CM | POA: Diagnosis not present

## 2019-12-08 DIAGNOSIS — R5381 Other malaise: Secondary | ICD-10-CM | POA: Diagnosis not present

## 2019-12-09 DIAGNOSIS — R262 Difficulty in walking, not elsewhere classified: Secondary | ICD-10-CM | POA: Diagnosis not present

## 2019-12-09 DIAGNOSIS — Z741 Need for assistance with personal care: Secondary | ICD-10-CM | POA: Diagnosis not present

## 2019-12-09 DIAGNOSIS — E1121 Type 2 diabetes mellitus with diabetic nephropathy: Secondary | ICD-10-CM | POA: Diagnosis not present

## 2019-12-10 DIAGNOSIS — R262 Difficulty in walking, not elsewhere classified: Secondary | ICD-10-CM | POA: Diagnosis not present

## 2019-12-10 DIAGNOSIS — E1121 Type 2 diabetes mellitus with diabetic nephropathy: Secondary | ICD-10-CM | POA: Diagnosis not present

## 2019-12-10 DIAGNOSIS — R5381 Other malaise: Secondary | ICD-10-CM | POA: Diagnosis not present

## 2019-12-10 DIAGNOSIS — Z741 Need for assistance with personal care: Secondary | ICD-10-CM | POA: Diagnosis not present

## 2019-12-11 DIAGNOSIS — R262 Difficulty in walking, not elsewhere classified: Secondary | ICD-10-CM | POA: Diagnosis not present

## 2019-12-11 DIAGNOSIS — Z741 Need for assistance with personal care: Secondary | ICD-10-CM | POA: Diagnosis not present

## 2019-12-11 DIAGNOSIS — I169 Hypertensive crisis, unspecified: Secondary | ICD-10-CM | POA: Diagnosis not present

## 2019-12-11 DIAGNOSIS — E1121 Type 2 diabetes mellitus with diabetic nephropathy: Secondary | ICD-10-CM | POA: Diagnosis not present

## 2019-12-11 DIAGNOSIS — D649 Anemia, unspecified: Secondary | ICD-10-CM | POA: Diagnosis not present

## 2019-12-12 DIAGNOSIS — Z741 Need for assistance with personal care: Secondary | ICD-10-CM | POA: Diagnosis not present

## 2019-12-12 DIAGNOSIS — R262 Difficulty in walking, not elsewhere classified: Secondary | ICD-10-CM | POA: Diagnosis not present

## 2019-12-12 DIAGNOSIS — E1121 Type 2 diabetes mellitus with diabetic nephropathy: Secondary | ICD-10-CM | POA: Diagnosis not present

## 2019-12-13 DIAGNOSIS — R262 Difficulty in walking, not elsewhere classified: Secondary | ICD-10-CM | POA: Diagnosis not present

## 2019-12-13 DIAGNOSIS — Z741 Need for assistance with personal care: Secondary | ICD-10-CM | POA: Diagnosis not present

## 2019-12-13 DIAGNOSIS — E1121 Type 2 diabetes mellitus with diabetic nephropathy: Secondary | ICD-10-CM | POA: Diagnosis not present

## 2019-12-15 DIAGNOSIS — Z741 Need for assistance with personal care: Secondary | ICD-10-CM | POA: Diagnosis not present

## 2019-12-15 DIAGNOSIS — R262 Difficulty in walking, not elsewhere classified: Secondary | ICD-10-CM | POA: Diagnosis not present

## 2019-12-15 DIAGNOSIS — E1121 Type 2 diabetes mellitus with diabetic nephropathy: Secondary | ICD-10-CM | POA: Diagnosis not present

## 2019-12-16 DIAGNOSIS — E1121 Type 2 diabetes mellitus with diabetic nephropathy: Secondary | ICD-10-CM | POA: Diagnosis not present

## 2019-12-16 DIAGNOSIS — R262 Difficulty in walking, not elsewhere classified: Secondary | ICD-10-CM | POA: Diagnosis not present

## 2019-12-16 DIAGNOSIS — Z741 Need for assistance with personal care: Secondary | ICD-10-CM | POA: Diagnosis not present

## 2019-12-17 DIAGNOSIS — R262 Difficulty in walking, not elsewhere classified: Secondary | ICD-10-CM | POA: Diagnosis not present

## 2019-12-17 DIAGNOSIS — Z741 Need for assistance with personal care: Secondary | ICD-10-CM | POA: Diagnosis not present

## 2019-12-17 DIAGNOSIS — E1121 Type 2 diabetes mellitus with diabetic nephropathy: Secondary | ICD-10-CM | POA: Diagnosis not present

## 2019-12-18 DIAGNOSIS — E1121 Type 2 diabetes mellitus with diabetic nephropathy: Secondary | ICD-10-CM | POA: Diagnosis not present

## 2019-12-18 DIAGNOSIS — R262 Difficulty in walking, not elsewhere classified: Secondary | ICD-10-CM | POA: Diagnosis not present

## 2019-12-18 DIAGNOSIS — Z741 Need for assistance with personal care: Secondary | ICD-10-CM | POA: Diagnosis not present

## 2019-12-19 DIAGNOSIS — Z741 Need for assistance with personal care: Secondary | ICD-10-CM | POA: Diagnosis not present

## 2019-12-19 DIAGNOSIS — R262 Difficulty in walking, not elsewhere classified: Secondary | ICD-10-CM | POA: Diagnosis not present

## 2019-12-19 DIAGNOSIS — E1121 Type 2 diabetes mellitus with diabetic nephropathy: Secondary | ICD-10-CM | POA: Diagnosis not present

## 2019-12-22 DIAGNOSIS — E1121 Type 2 diabetes mellitus with diabetic nephropathy: Secondary | ICD-10-CM | POA: Diagnosis not present

## 2019-12-22 DIAGNOSIS — Z741 Need for assistance with personal care: Secondary | ICD-10-CM | POA: Diagnosis not present

## 2019-12-22 DIAGNOSIS — R262 Difficulty in walking, not elsewhere classified: Secondary | ICD-10-CM | POA: Diagnosis not present

## 2019-12-23 DIAGNOSIS — R262 Difficulty in walking, not elsewhere classified: Secondary | ICD-10-CM | POA: Diagnosis not present

## 2019-12-23 DIAGNOSIS — E1121 Type 2 diabetes mellitus with diabetic nephropathy: Secondary | ICD-10-CM | POA: Diagnosis not present

## 2019-12-23 DIAGNOSIS — Z741 Need for assistance with personal care: Secondary | ICD-10-CM | POA: Diagnosis not present

## 2019-12-24 DIAGNOSIS — R262 Difficulty in walking, not elsewhere classified: Secondary | ICD-10-CM | POA: Diagnosis not present

## 2019-12-24 DIAGNOSIS — Z741 Need for assistance with personal care: Secondary | ICD-10-CM | POA: Diagnosis not present

## 2019-12-24 DIAGNOSIS — E1121 Type 2 diabetes mellitus with diabetic nephropathy: Secondary | ICD-10-CM | POA: Diagnosis not present

## 2019-12-25 DIAGNOSIS — R262 Difficulty in walking, not elsewhere classified: Secondary | ICD-10-CM | POA: Diagnosis not present

## 2019-12-25 DIAGNOSIS — E1121 Type 2 diabetes mellitus with diabetic nephropathy: Secondary | ICD-10-CM | POA: Diagnosis not present

## 2019-12-25 DIAGNOSIS — Z741 Need for assistance with personal care: Secondary | ICD-10-CM | POA: Diagnosis not present

## 2019-12-26 DIAGNOSIS — Z741 Need for assistance with personal care: Secondary | ICD-10-CM | POA: Diagnosis not present

## 2019-12-26 DIAGNOSIS — R262 Difficulty in walking, not elsewhere classified: Secondary | ICD-10-CM | POA: Diagnosis not present

## 2019-12-26 DIAGNOSIS — E1121 Type 2 diabetes mellitus with diabetic nephropathy: Secondary | ICD-10-CM | POA: Diagnosis not present

## 2019-12-28 DIAGNOSIS — R262 Difficulty in walking, not elsewhere classified: Secondary | ICD-10-CM | POA: Diagnosis not present

## 2019-12-28 DIAGNOSIS — E1121 Type 2 diabetes mellitus with diabetic nephropathy: Secondary | ICD-10-CM | POA: Diagnosis not present

## 2019-12-28 DIAGNOSIS — Z741 Need for assistance with personal care: Secondary | ICD-10-CM | POA: Diagnosis not present

## 2020-01-08 DIAGNOSIS — R5381 Other malaise: Secondary | ICD-10-CM | POA: Diagnosis not present

## 2020-01-19 DIAGNOSIS — R5381 Other malaise: Secondary | ICD-10-CM | POA: Diagnosis not present

## 2020-01-21 ENCOUNTER — Telehealth: Payer: Self-pay | Admitting: Cardiology

## 2020-01-21 NOTE — Telephone Encounter (Signed)
Pt daughter called in stated pt is under the care of a nursing home, She no longer needs to be seen in [facility]

## 2020-02-05 DIAGNOSIS — R5381 Other malaise: Secondary | ICD-10-CM | POA: Diagnosis not present

## 2020-06-08 DEATH — deceased

## 2020-12-08 IMAGING — US US RENAL
1 series · 14 of 25 positions shown · non-contrast
Comparison: CT abdomen [DATE].

CLINICAL DATA: Stage III B chronic kidney disease

EXAM:
RENAL / URINARY TRACT ULTRASOUND COMPLETE

[Series 1: us renal · 0.26mm/px · 14 of 42 slices shown]
[im 1/42]
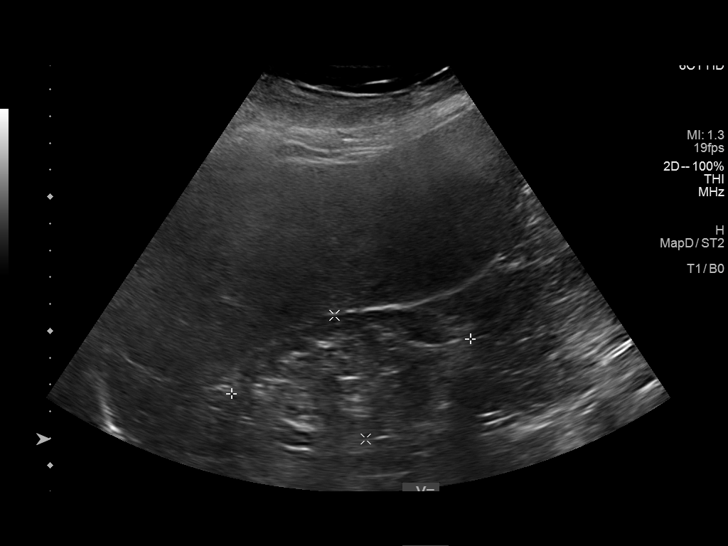
[im 4/42]
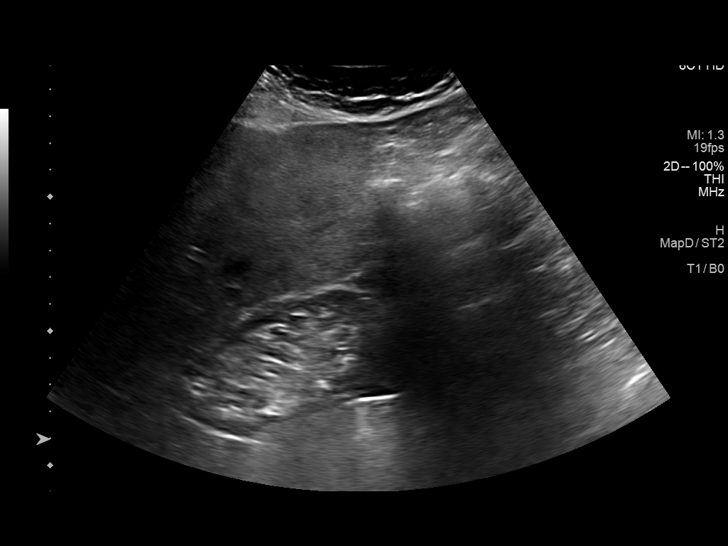
[im 7/42]
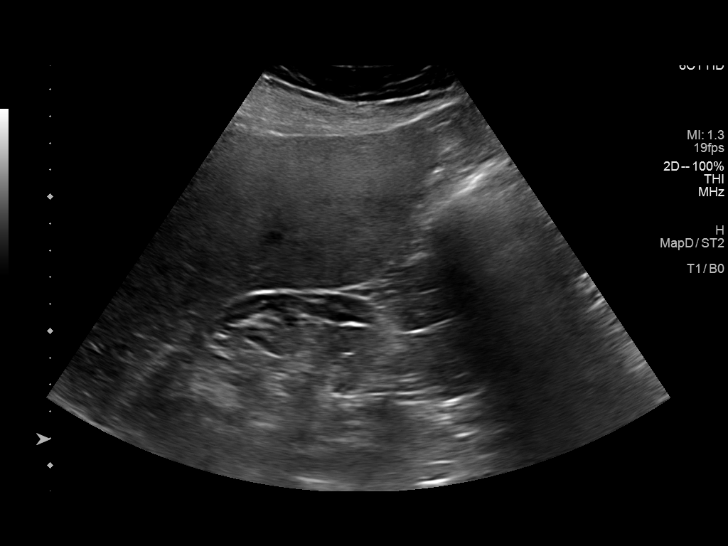
[im 11/42]
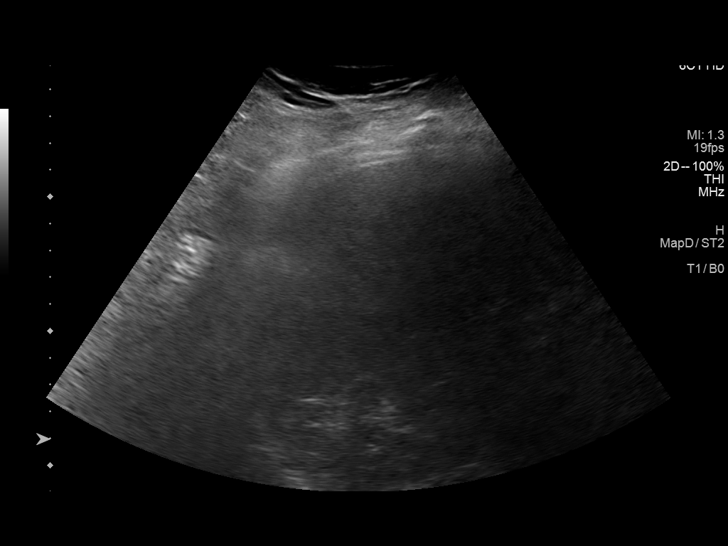
[im 14/42]
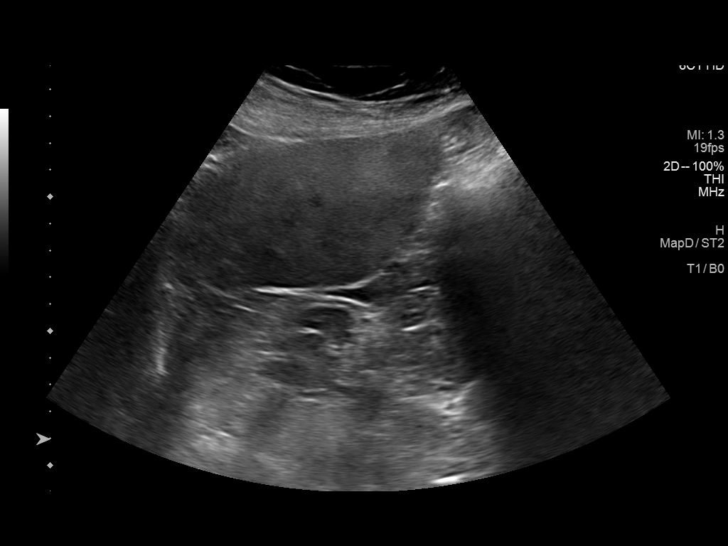
[im 16/42]
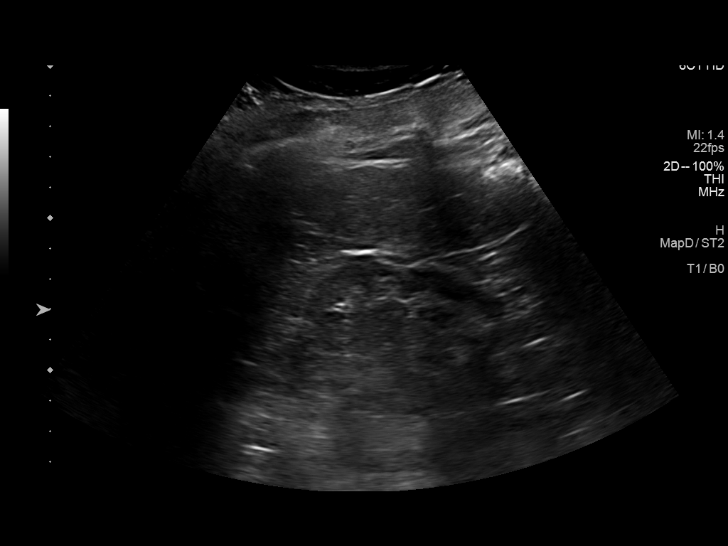
[im 19/42]
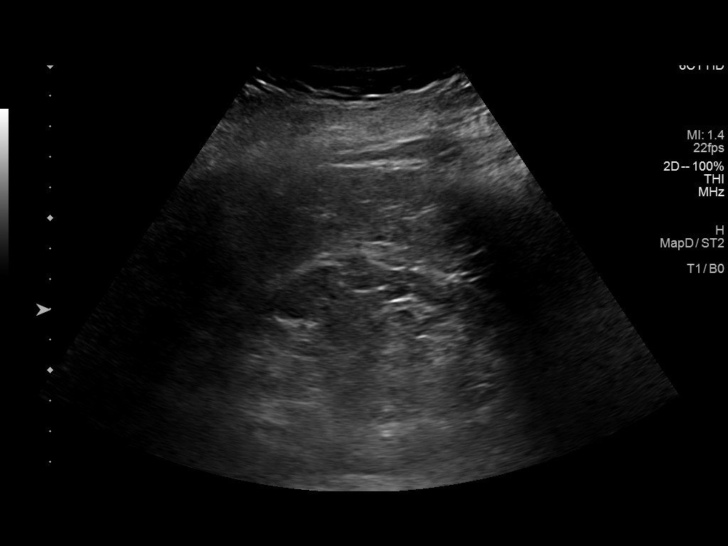
[im 23/42]
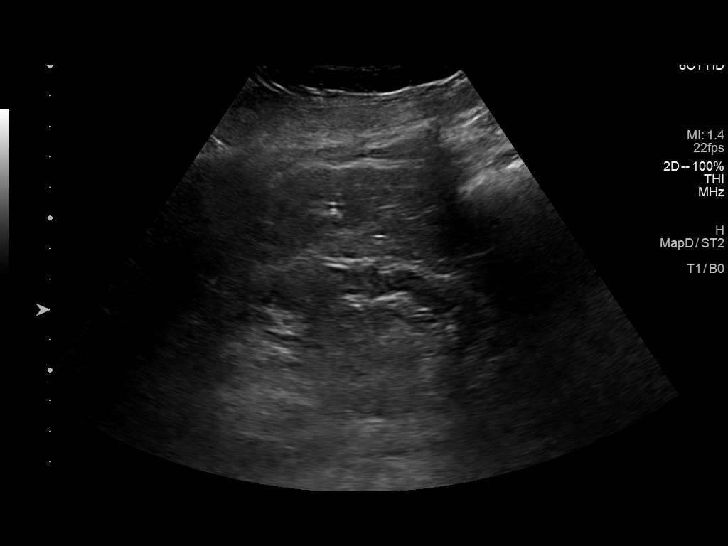
[im 26/42]
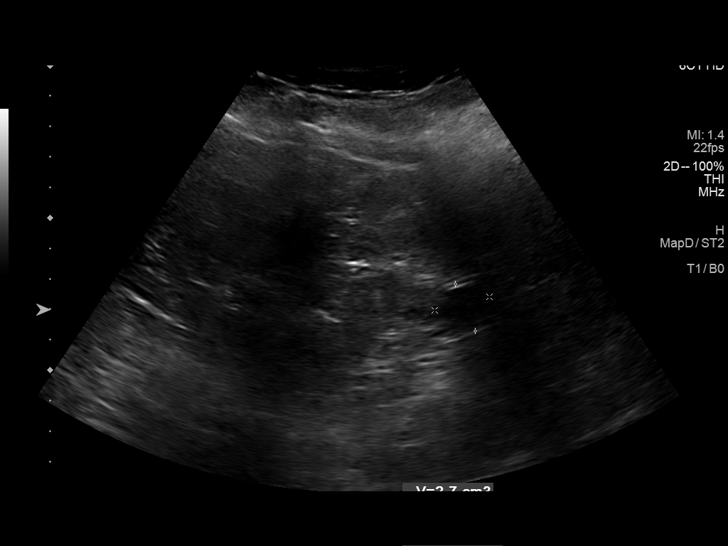
[im 28/42]
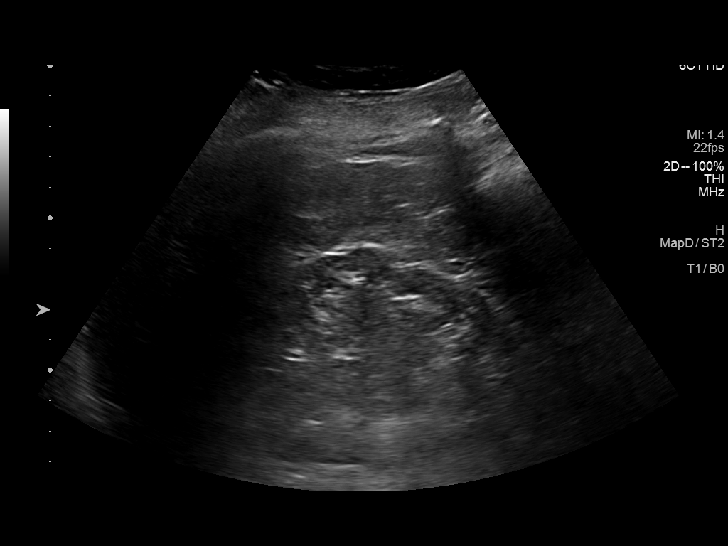
[im 31/42]
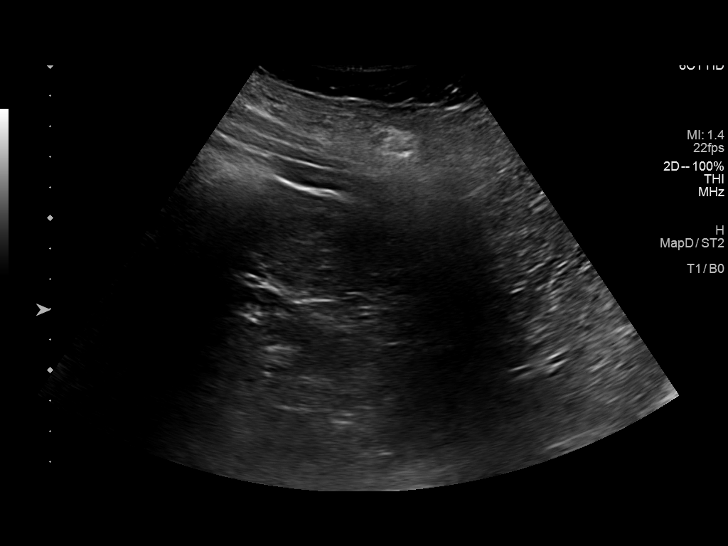
[im 35/42]
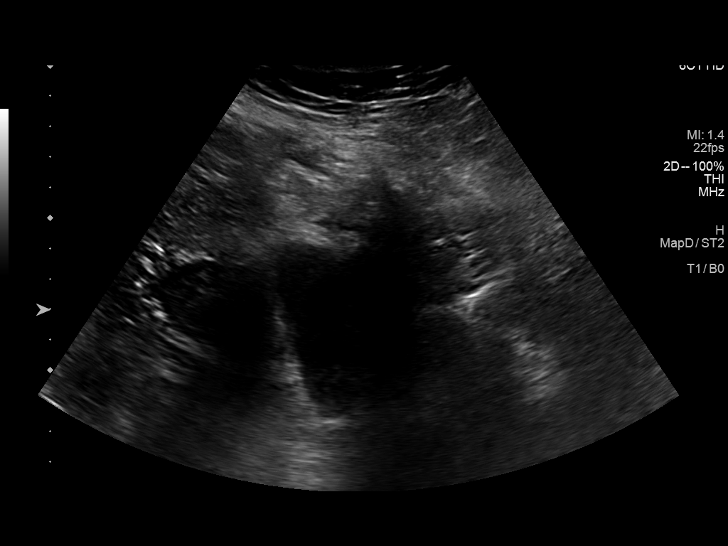
[im 38/42]
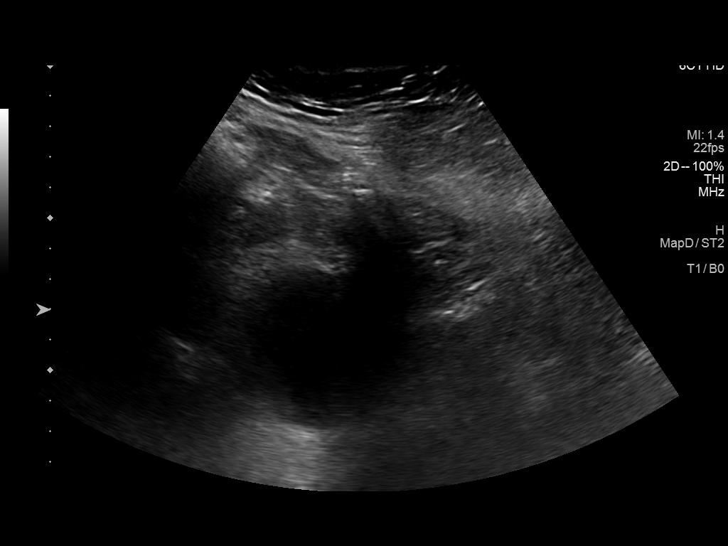
[im 42/42]
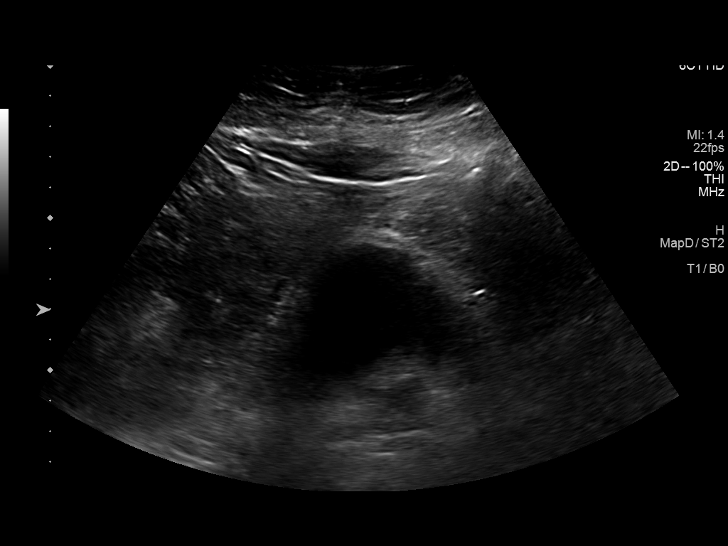

[14 of 25 positions shown; findings below may reference images not displayed]

FINDINGS: Right Kidney:

Renal measurements: 9.1 x 4.8 x 6.7 cm = volume: 150 cc mL. Diffuse
cortical thinning identified. Normal cortical echogenicity. No
hydronephrosis or mass.

Left Kidney:

Renal measurements: 7.6 x 3.2 x 4.5 cm = volume: 57 mL. Normal
cortical echogenicity. Diffuse cortical volume loss. Cyst is noted
within upper pole measuring 1.9 x 1.7 x 1.7 cm.

Bladder:

Appears normal for degree of bladder distention.

Other:

None.
IMPRESSION: 1. No acute abnormality.
2. Bilateral renal cortical atrophy.  No mass or hydronephrosis.
